# Patient Record
Sex: Male | Born: 1985 | Race: Black or African American | Hispanic: No | Marital: Single | Smoking: Current every day smoker
Health system: Southern US, Community
[De-identification: ages and names within clinical notes are randomized; demographics above are authoritative.]

## PROBLEM LIST (undated history)

## (undated) HISTORY — PX: CHEST TUBE INSERTION: SHX231

---
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
## 2008-12-25 ENCOUNTER — Emergency Department (HOSPITAL_COMMUNITY): Admission: EM | Admit: 2008-12-25 | Discharge: 2008-12-25 | Payer: Self-pay | Admitting: Emergency Medicine

## 2009-01-03 ENCOUNTER — Emergency Department (HOSPITAL_COMMUNITY): Admission: EM | Admit: 2009-01-03 | Discharge: 2009-01-03 | Payer: Self-pay | Admitting: Emergency Medicine
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
## 2010-11-08 LAB — CBC
HCT: 44.9
MCHC: 33.8
MCV: 92.6
RBC: 4.77
RBC: 4.85
RDW: 13.1
RDW: 13.3
WBC: 12.6 — ABNORMAL HIGH

## 2010-11-08 LAB — DIFFERENTIAL
Basophils Absolute: 0
Basophils Relative: 0
Eosinophils Absolute: 0
Lymphocytes Relative: 6 — ABNORMAL LOW
Neutro Abs: 11.2 — ABNORMAL HIGH

## 2010-11-08 LAB — TYPE AND SCREEN

## 2010-11-08 LAB — HEMOGLOBIN: Hemoglobin: 14.6

## 2019-09-07 ENCOUNTER — Ambulatory Visit (HOSPITAL_COMMUNITY)
Admission: RE | Admit: 2019-09-07 | Discharge: 2019-09-07 | Disposition: A | Discharge status: Home / Self Care | Payer: Self-pay | Attending: Psychiatry | Admitting: Psychiatry

## 2019-09-08 ENCOUNTER — Ambulatory Visit (HOSPITAL_COMMUNITY)
Admission: EM | Admit: 2019-09-08 | Discharge: 2019-09-08 | Disposition: A | Discharge status: Home / Self Care | Payer: No Payment, Other | Attending: Psychiatry | Admitting: Psychiatry

## 2019-09-08 ENCOUNTER — Other Ambulatory Visit: Payer: Self-pay

## 2019-09-08 DIAGNOSIS — R45851 Suicidal ideations: Secondary | ICD-10-CM | POA: Insufficient documentation

## 2019-09-08 DIAGNOSIS — R441 Visual hallucinations: Secondary | ICD-10-CM | POA: Insufficient documentation

## 2019-09-08 DIAGNOSIS — F29 Unspecified psychosis not due to a substance or known physiological condition: Secondary | ICD-10-CM

## 2019-09-08 DIAGNOSIS — F4310 Post-traumatic stress disorder, unspecified: Secondary | ICD-10-CM

## 2019-09-08 DIAGNOSIS — F329 Major depressive disorder, single episode, unspecified: Secondary | ICD-10-CM | POA: Insufficient documentation

## 2019-09-08 DIAGNOSIS — Z652 Problems related to release from prison: Secondary | ICD-10-CM | POA: Insufficient documentation

## 2019-09-08 MED ORDER — RISPERIDONE 1 MG PO TABS: 1.0000 mg | ORAL_TABLET | Freq: Two times a day (BID) | ORAL | 1 refills | Status: DC
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
## 2019-09-08 NOTE — Discharge Instructions (Signed)
Keep scheduled appointments Schedule an appointment for PCP Take medications as prescribed
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
## 2019-09-09 ENCOUNTER — Encounter (INDEPENDENT_AMBULATORY_CARE_PROVIDER_SITE_OTHER): Payer: Self-pay | Admitting: Primary Care

## 2019-09-09 ENCOUNTER — Ambulatory Visit (INDEPENDENT_AMBULATORY_CARE_PROVIDER_SITE_OTHER): Payer: Self-pay | Admitting: Primary Care

## 2019-09-09 ENCOUNTER — Other Ambulatory Visit: Payer: Self-pay

## 2019-09-09 ENCOUNTER — Ambulatory Visit: Payer: Self-pay | Attending: Primary Care | Admitting: Licensed Clinical Social Worker

## 2019-09-09 VITALS — BP 121/78 | HR 76 | Temp 98.6°F | Resp 16 | Ht 73.0 in | Wt 255.0 lb

## 2019-09-09 DIAGNOSIS — Z09 Encounter for follow-up examination after completed treatment for conditions other than malignant neoplasm: Secondary | ICD-10-CM

## 2019-09-09 DIAGNOSIS — F22 Delusional disorders: Secondary | ICD-10-CM

## 2019-09-09 DIAGNOSIS — E6609 Other obesity due to excess calories: Secondary | ICD-10-CM

## 2019-09-09 DIAGNOSIS — F4323 Adjustment disorder with mixed anxiety and depressed mood: Secondary | ICD-10-CM

## 2019-09-09 DIAGNOSIS — Z6833 Body mass index (BMI) 33.0-33.9, adult: Secondary | ICD-10-CM

## 2019-09-09 DIAGNOSIS — Z7689 Persons encountering health services in other specified circumstances: Secondary | ICD-10-CM

## 2019-09-09 MED ORDER — RISPERIDONE 1 MG PO TABS: 1.0000 mg | ORAL_TABLET | Freq: Two times a day (BID) | ORAL | 1 refills | Status: DC

## 2019-09-09 MED FILL — risperiDONE 1 MG TABS: 1 | 30 days supply | Qty: 60 | Fill #0

## 2019-09-09 NOTE — Progress Notes (Signed)
Assessment and Plan: Diagnoses and all orders for this visit:  Delusional disorder (HCC) -     risperiDONE (RISPERDAL) 1 MG tablet; Take 1 tablet (1 mg total) by mouth 2 (two) times daily.  Encounter to establish care Gwinda Passe, NP-C will be your  (PCP) she is mastered prepared . She is skilled to diagnosed and treat illness. Also able to answer health concern as well as continuing care of varied medical conditions, not limited by cause, organ system, or diagnosis.   Hospital discharge follow-up Patient has been scheduled to see clinical social worker today information given for walk-in clinic for psychosis prescription refill 1 time only until seen by therapist.  Class 1 obesity due to excess calories without serious comorbidity with body mass index (BMI) of 33.0 to 33.9 in adult Obesity is 30-39 indicating an excess in caloric intake or underlining conditions. This may lead to other co-morbidities. Lifestyle modifications of diet and exercise may reduce obesity.  Increased risk for respiratory problems, hypertension, diabetes, cardiovascular disease    HPI Mr. James Green 34 y.o.male presents for follow up from Emergency room on 09/08/19, patient was discharged from the emergency room 09/08/19, patient was seen for suicidal ideations with a plan to jump over an overpass.  He reported that he has  Dealt with these  symptoms for several years.  He states that he experienced his first hallucination when he was 34 years old and was telling him to kill himself.   No past medical history on file.   No Known Allergies    Current Outpatient Medications on File Prior to Visit  Medication Sig Dispense Refill  . acetaminophen (TYLENOL) 500 MG tablet Take 1,000 mg by mouth every 6 (six) hours as needed for headache.    . risperiDONE (RISPERDAL) 1 MG tablet Take 1 tablet (1 mg total) by mouth 2 (two) times daily. 60 tablet 1   No current facility-administered medications on file prior  to visit.    ROS: all negative except above.   Physical Exam: Filed Weights   09/09/19 1453  Weight: 255 lb (115.7 kg)   BP 121/78   Pulse 76   Temp 98.6 F (37 C)   Resp 16   Ht 6\' 1"  (1.854 m)   Wt 255 lb (115.7 kg)   SpO2 98%   BMI 33.64 kg/m  General Appearance: Well nourished, in no apparent distress. Eyes: PERRLA, EOMs, conjunctiva no swelling or erythema Sinuses: No Frontal/maxillary tenderness ENT/Mouth: Ext aud canals clear, TMs without erythema, bulging. No erythema, swelling, or exudate on post pharynx.  Tonsils not swollen or erythematous. Hearing normal.  Neck: Supple, thyroid normal.  Respiratory: Respiratory effort normal, BS equal bilaterally without rales, rhonchi, wheezing or stridor.  Cardio: RRR with no MRGs. Brisk peripheral pulses without edema.  Abdomen: Soft, + BS.  Non tender, no guarding, rebound, hernias, masses. Lymphatics: Non tender without lymphadenopathy.  Musculoskeletal: Full ROM, 5/5 strength, normal gait.  Skin: Warm, dry without rashes, lesions, ecchymosis.  Neuro: Cranial nerves intact. Normal muscle tone, no cerebellar symptoms. Sensation intact.  Psych: Awake and oriented X 3, normal affect, Insight and Judgment appropriate.     , NP 3:02 PM [facility]

## 2019-09-09 NOTE — Patient Instructions (Addendum)
Please use [facility] located on [address] [place].  Friday they have a walk-in clinic from 1-5.  Please make it a priority to be the this Friday. A 30-day refill of your Risperdal 1mg  twice daily.  This is a one-time refill only to get to establish with behavioral health and follow-up with clinical social worker today at 4:10

## 2019-09-09 NOTE — Progress Notes (Signed)
Here for HFU  Denies any thoughts of suicidal ideation at this time.

## 2019-09-11 ENCOUNTER — Telehealth: Payer: Self-pay | Admitting: Licensed Clinical Social Worker

## 2019-09-11 NOTE — Telephone Encounter (Signed)
Call placed to patient to inform him that prescribed medication is waiting at [facility] for $10. Pt's voicemail is not set up; therefore, LCSW was unable to leave a message.

## 2019-09-17 ENCOUNTER — Telehealth (INDEPENDENT_AMBULATORY_CARE_PROVIDER_SITE_OTHER): Payer: Self-pay | Admitting: Licensed Clinical Social Worker

## 2019-09-17 NOTE — Telephone Encounter (Signed)
Call placed to patient regarding medications. LCSW left a message with patient's girlfriend requesting a return call.

## 2019-09-23 NOTE — Progress Notes (Signed)
Integrated Behavioral Health Initial Visit  MRN: [id number] Name: James Green  Number of Integrated Behavioral Health Clinician visits:: 1/6 Session Start time: 4:30 PM  Session End time: 5:00 PM Total time: 30  Type of Service: Integrated Behavioral Health- Individual Interpretor:No. Interpretor Name and Language: na   Warm Hand Off Completed.       SUBJECTIVE: James Green is a 34 y.o. male accompanied by Self Patient was referred by NP Randa Evens for care of her health conditions. 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  Patient reports ongoing stress which resulted in recent hospitalization Duration of problem: Ongoing; Severity of problem: severe  OBJECTIVE: Mood: Anxious and Affect: Appropriate Risk of harm to self or others: No plan to harm self or others patient denies current suicidal and/or homicidal ideations.  Protective factors were identified and crisis resources system was provided  LIFE CONTEXT: Family and Social: Received strong support from girlfriend who he resides with School/Work: Employed  Self-Care: She has agreed to participate in medication management through provider Life Changes: Patient reports difficulty managing stressors  GOALS ADDRESSED: Patient will: 1. Reduce symptoms of: stress 2. Increase knowledge and/or ability of: coping skills and healthy habits  3. Demonstrate ability to: Increase healthy adjustment to current life circumstances and Increase adequate support systems for patient/family  INTERVENTIONS: Interventions utilized: Supportive Counseling and Link to [facility]  Standardized Assessments completed: Not Needed  ASSESSMENT: Patient currently experiencing difficulty managing stress.  He has recently returned from serving over 10 years in prison and has had difficulty transitioning.  Patient denies current suicidal and/or homicidal ideations.   Protective factors were identified and crisis resources system was provided.   Patient may benefit from therapy and medication management to strengthen support system.  Validation and encouragement provided.  Healthy coping skills identified by patient.  LCSW provided patient with resources to establish therapy and medication management.  PLAN: 1. Follow up with behavioral health clinician on : Contact LCSW with any additional behavioral health and/or resource needs 2. Behavioral recommendations: Utilize resources provided and continue to utilize healthy coping skills 3. Referral(s): Community Mental Health Services (LME/Outside Clinic) 4. "From scale of 1-10, how likely are you to follow plan?":   Bridgett Larsson, LCSW 09/23/2019 10:07 AM

## 2019-10-13 ENCOUNTER — Encounter (HOSPITAL_COMMUNITY): Payer: Self-pay | Admitting: Psychiatry

## 2019-11-08 ENCOUNTER — Other Ambulatory Visit: Payer: Self-pay

## 2019-11-08 ENCOUNTER — Emergency Department (HOSPITAL_COMMUNITY)
Admission: EM | Admit: 2019-11-08 | Discharge: 2019-11-09 | Disposition: A | Discharge status: Other Acute Inpatient Hospital | Payer: Self-pay | Attending: Emergency Medicine | Admitting: Emergency Medicine

## 2019-11-08 ENCOUNTER — Ambulatory Visit (HOSPITAL_COMMUNITY)
Admission: EM | Admit: 2019-11-08 | Discharge: 2019-11-08 | Disposition: A | Discharge status: Other Acute Inpatient Hospital | Payer: No Payment, Other | Attending: Psychiatry | Admitting: Psychiatry

## 2019-11-08 DIAGNOSIS — Z20822 Contact with and (suspected) exposure to covid-19: Secondary | ICD-10-CM | POA: Insufficient documentation

## 2019-11-08 DIAGNOSIS — Z560 Unemployment, unspecified: Secondary | ICD-10-CM | POA: Diagnosis not present

## 2019-11-08 DIAGNOSIS — Z9151 Personal history of suicidal behavior: Secondary | ICD-10-CM | POA: Insufficient documentation

## 2019-11-08 DIAGNOSIS — F4310 Post-traumatic stress disorder, unspecified: Secondary | ICD-10-CM | POA: Diagnosis not present

## 2019-11-08 DIAGNOSIS — F17200 Nicotine dependence, unspecified, uncomplicated: Secondary | ICD-10-CM | POA: Insufficient documentation

## 2019-11-08 DIAGNOSIS — F32A Depression, unspecified: Secondary | ICD-10-CM

## 2019-11-08 DIAGNOSIS — S0083XA Contusion of other part of head, initial encounter: Secondary | ICD-10-CM | POA: Insufficient documentation

## 2019-11-08 DIAGNOSIS — R45851 Suicidal ideations: Secondary | ICD-10-CM | POA: Insufficient documentation

## 2019-11-08 DIAGNOSIS — F333 Major depressive disorder, recurrent, severe with psychotic symptoms: Secondary | ICD-10-CM | POA: Diagnosis not present

## 2019-11-08 DIAGNOSIS — W228XXA Striking against or struck by other objects, initial encounter: Secondary | ICD-10-CM | POA: Insufficient documentation

## 2019-11-08 DIAGNOSIS — F329 Major depressive disorder, single episode, unspecified: Secondary | ICD-10-CM | POA: Insufficient documentation

## 2019-11-08 LAB — RAPID URINE DRUG SCREEN, HOSP PERFORMED
Amphetamines: NOT DETECTED
Barbiturates: NOT DETECTED
Benzodiazepines: NOT DETECTED
Cocaine: NOT DETECTED
Opiates: NOT DETECTED
Tetrahydrocannabinol: POSITIVE — AB

## 2019-11-08 LAB — CBC WITH DIFFERENTIAL/PLATELET
Abs Immature Granulocytes: 0.05 10*3/uL (ref 0.00–0.07)
Basophils Absolute: 0 10*3/uL (ref 0.0–0.1)
Basophils Relative: 1 %
Eosinophils Absolute: 0.1 10*3/uL (ref 0.0–0.5)
Eosinophils Relative: 1 %
HCT: 46.5 % (ref 39.0–52.0)
Hemoglobin: 15.9 g/dL (ref 13.0–17.0)
Immature Granulocytes: 1 %
Lymphocytes Relative: 34 %
Lymphs Abs: 2.9 10*3/uL (ref 0.7–4.0)
MCH: 31.7 pg (ref 26.0–34.0)
MCHC: 34.2 g/dL (ref 30.0–36.0)
MCV: 92.8 fL (ref 80.0–100.0)
Monocytes Absolute: 0.6 10*3/uL (ref 0.1–1.0)
Monocytes Relative: 8 %
Neutro Abs: 4.7 10*3/uL (ref 1.7–7.7)
Neutrophils Relative %: 55 %
Platelets: 233 10*3/uL (ref 150–400)
RBC: 5.01 MIL/uL (ref 4.22–5.81)
RDW: 13.2 % (ref 11.5–15.5)
WBC: 8.4 10*3/uL (ref 4.0–10.5)
nRBC: 0 % (ref 0.0–0.2)

## 2019-11-08 LAB — COMPREHENSIVE METABOLIC PANEL
ALT: 22 U/L (ref 0–44)
AST: 23 U/L (ref 15–41)
Albumin: 4.4 g/dL (ref 3.5–5.0)
Alkaline Phosphatase: 58 U/L (ref 38–126)
Anion gap: 14 (ref 5–15)
BUN: 13 mg/dL (ref 6–20)
CO2: 26 mmol/L (ref 22–32)
Calcium: 9.9 mg/dL (ref 8.9–10.3)
Chloride: 101 mmol/L (ref 98–111)
Creatinine, Ser: 1.37 mg/dL — ABNORMAL HIGH (ref 0.61–1.24)
GFR, Estimated: >60 mL/min (ref >60)
Glucose, Bld: 89 mg/dL (ref 70–99)
Potassium: 3.8 mmol/L (ref 3.5–5.1)
Sodium: 141 mmol/L (ref 135–145)
Total Bilirubin: 1 mg/dL (ref 0.3–1.2)
Total Protein: 8 g/dL (ref 6.5–8.1)

## 2019-11-08 LAB — RESPIRATORY PANEL BY RT PCR (FLU A&B, COVID)
Influenza A by PCR: NEGATIVE
Influenza B by PCR: NEGATIVE
SARS Coronavirus 2 by RT PCR: NEGATIVE

## 2019-11-08 LAB — ETHANOL: Alcohol, Ethyl (B): <10 mg/dL (ref <10)

## 2019-11-08 MED ORDER — ALUM & MAG HYDROXIDE-SIMETH 200-200-20 MG/5ML PO SUSP: 30.0000 mL | Freq: Four times a day (QID) | ORAL | Status: DC | PRN

## 2019-11-08 MED ORDER — ACETAMINOPHEN 325 MG PO TABS: 650.0000 mg | ORAL_TABLET | Freq: Once | ORAL | Status: DC

## 2019-11-08 MED ORDER — ACETAMINOPHEN 500 MG PO TABS: 1000.0000 mg | ORAL_TABLET | Freq: Four times a day (QID) | ORAL | Status: DC | PRN

## 2019-11-08 MED ORDER — ONDANSETRON HCL 4 MG PO TABS: 4.0000 mg | ORAL_TABLET | Freq: Three times a day (TID) | ORAL | Status: DC | PRN

## 2019-11-08 MED ORDER — RISPERIDONE 1 MG PO TABS
1.0000 mg | ORAL_TABLET | Freq: Two times a day (BID) | ORAL | Status: DC
  Administered 2019-11-09: 1 mg via ORAL
  Filled 2019-11-08: qty 1

## 2019-11-08 MED ORDER — LORAZEPAM 1 MG PO TABS
1.0000 mg | ORAL_TABLET | Freq: Four times a day (QID) | ORAL | Status: DC | PRN
  Administered 2019-11-08 – 2019-11-09 (×2): 1 mg via ORAL
  Filled 2019-11-08 (×2): qty 1

## 2019-11-08 NOTE — ED Provider Notes (Signed)
Behavioral Health Admission H&P [facility](FBC & OBS)  Date: 11/08/19 Patient Name: James Green MRN: [id number] Chief Complaint:  Chief Complaint  Patient presents with  . Suicidal      Diagnoses:  Final diagnoses:  MDD (major depressive disorder), recurrent, severe, with psychosis (HCC)  PTSD (post-traumatic stress disorder)    HPI: [facility]-in assessment accompanied by mobile crisis counselor. Mobile crisis counselor was contacted by patient's girlfriend after patient endorsed suicidal ideations with a plan to cut himself.  Patient states "my girlfriend called the crisis team on me because of the way that I have been acting lately, I feel like I lost control of my people." Patient refers to himself as "[name]" times then refers to himself as "[name]" at times. Patient presents with paranoid behavior when discussing girlfriend contacting mobile crisis to "force patient to come here."  Patient endorses suicidal ideations. Patient states "I will leave here and I will go someplace and kill myself and no one will ever find me." Patient does not disclose plan at this time. Patient endorses 2 prior suicide attempts, last attempt approximately 10 months ago. Patient engaging in self-harm behaviors. Patient observed attempting to bang his head against the table in punching himself with a closed fist in the face.  Patient denies homicidal ideations. Patient endorses auditory and visual hallucinations. Patient states "I was convicted of second-degree murder and I spent 10-1/2 years in prison but my friend actually killed the man." Patient reports when he is alone he can see the face of the man that was killed. Patient reports auditory hallucinations, hearing "kill yourself and go to hell so we can fight."  Patient assessed by nurse practitioner. Patient alert and oriented. Patient emotionally labile throughout assessment. Patient tearful  at times, patient physically and verbally aggressive at times.  Patient reports recent stressors include being forced to move in with his girlfriend and [place] 2 children [place] after his brother put him out of the home on 10/06/2019. Patient reports he is unemployed currently. Patient reports feeling unable to work, this is a stressor as he has responsibilities. Patient reports he was released from prison in January 2021 and was dismissed from probation on 10/06/2019. Patient reports occasional alcohol use. Patient reports daily marijuana use, patient believes marijuana improves his mood.  Patient diagnosed with PTSD and major depressive disorder with psychosis.  [facility]  Patient reports he did not follow-up with outpatient psychiatry appointments and did not continue Risperdal after first dose.  Patient states "I cannot explain why I did not follow-up."  Patient agrees with plan to restart Risperdal.  Patient offered support and encouragement.  Patient gives verbal consent to speak with his girlfriend, Engineer, maintenancehanice. Patients girlfriend reports she is very concerned for patient's safety. Patient's girlfriend reports patient reported suicidal ideations with a plan to cut himself with a knife to her earlier today. Also girlfriend reports, patient "banged his head on the wall about 50 times until his eyes rolled back in his head last night."   PHQ 2-9:    [facility]/09/[facility]  Thoughts that you would be better off dead, or of hurting yourself in some way Several days  PHQ-9 Total Score 21        Total Time spent with patient: 30 minutes  Musculoskeletal  Strength & Muscle Tone: within normal limits Gait & Station: normal Patient leans:  N/A  Psychiatric Specialty Exam  Presentation General Appearance: Disheveled  Eye Contact:Fair  Speech:Clear and Coherent  Speech  Volume:Increased  Handedness:Right   Mood and Affect  Mood:Anxious;Labile  Affect:Labile;Inappropriate;Tearful   Thought Process  Thought Processes:Coherent  Descriptions of Associations:Intact  Orientation:Full (Time, Place and Person)  Thought Content:Paranoid Ideation  Hallucinations:Hallucinations: Auditory;Visual Description of Auditory Hallucinations: Patient states "I was convicted of second-degree murder and sometimes I see the man's face that my friend murdered." Patient states "the man tells me to kill myself and go to hell so that we can fight."  Ideas of Reference:Paranoia  Suicidal Thoughts:Suicidal Thoughts: Yes, Passive SI Passive Intent and/or Plan: With Intent;Without Plan  Homicidal Thoughts:Homicidal Thoughts: No   Sensorium  Memory:Immediate Good;Recent Good;Remote Good  Judgment:Impaired  Insight:Lacking   Executive Functions  Concentration:Fair  Attention Span:Fair  Recall:Fair  Fund of Knowledge:Fair  Language:Fair   Psychomotor Activity  Psychomotor Activity:Psychomotor Activity: Increased   Assets  Assets:Communication Skills;Desire for Improvement;Physical Health;Resilience;Social Support   Sleep  Sleep:Sleep: Poor   Physical Exam Vitals and nursing note reviewed.  Constitutional:      Appearance: He is well-developed.  HENT:     Head: Normocephalic.  Cardiovascular:     Rate and Rhythm: Normal rate.  Pulmonary:     Effort: Pulmonary effort is normal.  Neurological:     Mental Status: He is alert and oriented to person, place, and time.  Psychiatric:        Attention and Perception: He perceives auditory and visual hallucinations.        Mood and Affect: Mood is anxious. Affect is labile and inappropriate.        Speech: Speech is tangential.        Behavior: Behavior is agitated and aggressive.        Thought Content: Thought content is paranoid and delusional. Thought content includes suicidal ideation.  Thought content includes suicidal plan.        Judgment: Judgment is impulsive and inappropriate.    Review of Systems  Constitutional: Negative.   HENT: Negative.   Eyes: Negative.   Respiratory: Negative.   Cardiovascular: Negative.   Gastrointestinal: Negative.   Genitourinary: Negative.   Musculoskeletal: Negative.   Skin: Negative.   Neurological: Negative.   Endo/Heme/Allergies: Negative.   Psychiatric/Behavioral: Positive for hallucinations, substance abuse and suicidal ideas. The patient is nervous/anxious.     There were no vitals taken for this visit. There is no height or weight on file to calculate BMI.  Past Psychiatric History: MDD with psychosis, PTSD  Is the patient at risk to self? Yes  Has the patient been a risk to self in the past 6 months? Yes .    Has the patient been a risk to self within the distant past? Yes   Is the patient a risk to others? No   Has the patient been a risk to others in the past 6 months? No   Has the patient been a risk to others within the distant past? No   Past Medical History: No past medical history on file. No past surgical history on file.  Family History: No family history on file.  Social History:  Social History   Socioeconomic History  . Marital status: Single    Spouse name: Not on file  . Number of children: Not on file  . Years of education: Not on file  . Highest education level: Not on file  Occupational History  . Not on file  Tobacco  Use  . Smoking status: Current Every Day Smoker    Packs/day: 0.50  . Smokeless tobacco: Never Used  Substance and Sexual Activity  . Alcohol use: Yes    Alcohol/week: 2.0 standard drinks    Types: 2 Cans of beer per week    Comment: ocassionaly   . Drug use: Not Currently  . Sexual activity: Not Currently  Other Topics Concern  . Not on file  Social History Narrative  . Not on file   Social Determinants of Health   Financial Resource Strain:   . Difficulty of  Paying Living Expenses: Not on file  Food Insecurity:   . Worried About Programme researcher, broadcasting/film/video in the Last Year: Not on file  . Ran Out of Food in the Last Year: Not on file  Transportation Needs:   . Lack of Transportation (Medical): Not on file  . Lack of Transportation (Non-Medical): Not on file  Physical Activity:   . Days of Exercise per Week: Not on file  . Minutes of Exercise per Session: Not on file  Stress:   . Feeling of Stress : Not on file  Social Connections:   . Frequency of Communication with Friends and Family: Not on file  . Frequency of Social Gatherings with Friends and Family: Not on file  . Attends Religious Services: Not on file  . Active Member of Clubs or Organizations: Not on file  . Attends Banker Meetings: Not on file  . Marital Status: Not on file  Intimate Partner Violence:   . Fear of Current or Ex-Partner: Not on file  . Emotionally Abused: Not on file  . Physically Abused: Not on file  . Sexually Abused: Not on file    SDOH:  SDOH Screenings   Alcohol Screen:   . Last Alcohol Screening Score (AUDIT): Not on file  Depression (PHQ2-9): Medium Risk  . PHQ-2 Score: 21  Financial Resource Strain:   . Difficulty of Paying Living Expenses: Not on file  Food Insecurity:   . Worried About Programme researcher, broadcasting/film/video in the Last Year: Not on file  . Ran Out of Food in the Last Year: Not on file  Housing:   . Last Housing Risk Score: Not on file  Physical Activity:   . Days of Exercise per Week: Not on file  . Minutes of Exercise per Session: Not on file  Social Connections:   . Frequency of Communication with Friends and Family: Not on file  . Frequency of Social Gatherings with Friends and Family: Not on file  . Attends Religious Services: Not on file  . Active Member of Clubs or Organizations: Not on file  . Attends Banker Meetings: Not on file  . Marital Status: Not on file  Stress:   . Feeling of Stress : Not on file   Tobacco Use: High Risk  . Smoking Tobacco Use: Current Every Day Smoker  . Smokeless Tobacco Use: Never Used  Transportation Needs:   . Freight forwarder (Medical): Not on file  . Lack of Transportation (Non-Medical): Not on file    Last Labs:  No visits with results within 6 Month(s) from this visit.  Latest known visit with results is:  Admission on 11/14/2006, Discharged on 11/18/2006  Component Date Value Ref Range Status  . ABO/RH(D) 11/14/2006 B POS   Final  . Antibody Screen 11/14/2006 NEG   Final  . Sample Expiration 11/14/2006 11/17/2006   Final  . Unit  Number 11/14/2006 [id number]   Final  . Blood Component Type 11/14/2006 RBC LR PHER1   Final  . Status of Unit 11/14/2006 REL FROM [facility]   Final  . Transfusion Status 11/14/2006 OK TO TRANSFUSE   Final  . Crossmatch Result 11/14/2006 COMPATIBLE   Final  . Unit tag comment 11/14/2006 VERBAL ORDERS PER DR ZAMMIT   Final  . Unit Number 11/14/2006 [id number]   Final  . Blood Component Type 11/14/2006 RBC LR PHER2   Final  . Status of Unit 11/14/2006 REL FROM [facility]   Final  . Transfusion Status 11/14/2006 OK TO TRANSFUSE   Final  . Crossmatch Result 11/14/2006 COMPATIBLE   Final  . Unit tag comment 11/14/2006 VERBAL ORDERS PER DR ZAMMIT   Final  . ABO/RH(D) 11/14/2006 B POS   Final  . WBC 11/14/2006 6.6   Final  . RBC 11/14/2006 4.77   Final  . Hemoglobin 11/14/2006 15.0   Final  . HCT 11/14/2006 44.4   Final  . MCV 11/14/2006 93.0   Final  . MCHC 11/14/2006 33.8   Final  . RDW 11/14/2006 13.1   Final  . Platelets 11/14/2006 207   Final  . Prothrombin Time 11/14/2006 13.9   Final  . INR 11/14/2006 1.1   Final  . WBC 11/14/2006 12.6*  Final  . RBC 11/14/2006 4.85   Final  . Hemoglobin 11/14/2006 15.1   Final  . HCT 11/14/2006 44.9   Final  . MCV 11/14/2006 92.6   Final  . MCHC 11/14/2006 33.6   Final  . RDW 11/14/2006 13.3   Final  . Platelets 11/14/2006 215   Final  . Neutrophils Relative % 11/14/2006 88*  Final   . Neutro Abs 11/14/2006 11.2*  Final  . Lymphocytes Relative 11/14/2006 6*  Final  . Lymphs Abs 11/14/2006 0.7*  Final  . Monocytes Relative 11/14/2006 6   Final  . Monocytes Absolute 11/14/2006 0.7   Final  . Eosinophils Relative 11/14/2006 0   Final  . Eosinophils Absolute 11/14/2006 0.0   Final  . Basophils Relative 11/14/2006 0   Final  . Basophils Absolute 11/14/2006 0.0   Final  . Hemoglobin 11/15/2006 14.6   Final    Allergies: Patient has no known allergies.  PTA Medications: (Not in a hospital admission)   Medical Decision Making  Patient reviewed with Dr. Nelly Rout.  Patient presents with active suicidal ideations and active self-harm behaviors.  Patient requires involuntary commitment petition at this time. Patient will be transferred to [facility] emergency department for medical clearance and to await appropriate inpatient psychiatric placement.  Verbal handoff to [facility] emergency department physician. Recommendations  Based on my evaluation the patient appears to have an emergency medical condition for which I recommend the patient be transferred to the emergency department for further evaluation.  Patrcia Dolly, FNP 11/08/19  5:37 PM

## 2019-11-08 NOTE — ED Triage Notes (Signed)
Pt arrived via police escort, sent from [facility], due to IVC, per pt girlfriend, pt manic at home, becoming aggressive with her. Pt calm and cooperative in triage.

## 2019-11-08 NOTE — ED Notes (Signed)
Pt refused blood draw
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
## 2019-11-08 NOTE — ED Notes (Addendum)
Pt was asked if girlfriend could have keys and gave his permission to hand his keys to his girlfriend.  Girlfriend picked up keys.

## 2019-11-08 NOTE — ED Notes (Signed)
Patient changed into paper scrubs and wanded by security. Two labeled patient belongings secured at triage nurses station.

## 2019-11-08 NOTE — ED Provider Notes (Signed)
James Green-EMERGENCY DEPT Provider Note   CSN: [id number] Arrival date & time: 11/08/19  1858     History Chief Complaint  Patient presents with  . Psychiatric Evaluation    James Green is a 34 y.o. male.  HPI   Patient presented to the ED for medical clearance.  Has history of depression as well as P TSD.  Patient states he has been incarcerated for 10 years.  He is having a difficult time adjusting since getting out of jail.  Patient states he still has a lot of anxiety and fear it is interfering with his ability to work.  Patient has been banging his head against the wall.  Patient states he does that to try to destress.  Patient reportedly told his girlfriend that he was going to go away somewhere and kill himself and no one would find him.  Patient was evaluated by behavioral health center today  No past medical history on file.  Patient Active Problem List   Diagnosis Date Noted  . Severe episode of recurrent major depressive disorder, with psychotic features (HCC)   . PTSD (post-traumatic stress disorder)     No past surgical history on file.     No family history on file.  Social History   Tobacco Use  . Smoking status: Current Every Day Smoker    Packs/day: 0.50  . Smokeless tobacco: Never Used  Substance Use Topics  . Alcohol use: Yes    Alcohol/week: 2.0 standard drinks    Types: 2 Cans of beer per week    Comment: ocassionaly   . Drug use: Not Currently    Home Medications Prior to Admission medications   Medication Sig Start Date End Date Taking? Authorizing Provider  acetaminophen (TYLENOL) 500 MG tablet Take 1,000 mg by mouth every 6 (six) hours as needed for headache.    [provider]  risperiDONE (RISPERDAL) 1 MG tablet Take 1 tablet (1 mg total) by mouth 2 (two) times daily. 09/09/19 11/08/19  Grayce Sessions, NP    Allergies    Patient has no known allergies.  Review of Systems   Review of Systems    All other systems reviewed and are negative.   Physical Exam Updated Vital Signs BP 100/85   Pulse (!) 58   Temp 98.6 F (37 C)   Resp 16   SpO2 98%   Physical Exam Vitals and nursing note reviewed.  Constitutional:      General: He is not in acute distress.    Appearance: He is well-developed.  HENT:     Head: Normocephalic.     Comments: Small contusion forehead    Right Ear: External ear normal.     Left Ear: External ear normal.  Eyes:     General: No scleral icterus.       Right eye: No discharge.        Left eye: No discharge.     Conjunctiva/sclera: Conjunctivae normal.  Neck:     Trachea: No tracheal deviation.  Cardiovascular:     Rate and Rhythm: Normal rate and regular rhythm.  Pulmonary:     Effort: Pulmonary effort is normal. No respiratory distress.     Breath sounds: Normal breath sounds. No stridor. No wheezing or rales.  Abdominal:     General: Bowel sounds are normal. There is no distension.     Palpations: Abdomen is soft.     Tenderness: There is no abdominal tenderness. There is  no guarding or rebound.  Musculoskeletal:        General: No tenderness.     Cervical back: Neck supple.  Skin:    General: Skin is warm and dry.     Findings: No rash.  Neurological:     Mental Status: He is alert.     Cranial Nerves: No cranial nerve deficit (no facial droop, extraocular movements intact, no slurred speech).     Sensory: No sensory deficit.     Motor: No abnormal muscle tone or seizure activity.     Coordination: Coordination normal.  Psychiatric:        Attention and Perception: Attention normal.        Mood and Affect: Mood is depressed.        Speech: Speech normal.        Behavior: Behavior is cooperative.        Thought Content: Thought content includes suicidal ideation.     Comments: Somewhat rambling speech     ED Results / Procedures / Treatments   Labs (all labs ordered are listed, but only abnormal results are displayed) Labs  Reviewed  COMPREHENSIVE METABOLIC PANEL - Abnormal; Notable for the following components:      Result Value   Creatinine, Ser 1.37 (*)    All other components within normal limits  RAPID URINE DRUG SCREEN, HOSP PERFORMED - Abnormal; Notable for the following components:   Tetrahydrocannabinol POSITIVE (*)    All other components within normal limits  RESPIRATORY PANEL BY RT PCR (FLU A&B, COVID)  ETHANOL  CBC WITH DIFFERENTIAL/PLATELET    EKG None  Radiology No results found.  Procedures Procedures (including critical care time)  Medications Ordered in ED Medications  acetaminophen (TYLENOL) tablet 650 mg (has no administration in time range)  acetaminophen (TYLENOL) tablet 1,000 mg (has no administration in time range)  risperiDONE (RISPERDAL) tablet 1 mg (has no administration in time range)  alum & mag hydroxide-simeth (MAALOX/MYLANTA) 200-200-20 MG/5ML suspension 30 mL (has no administration in time range)  ondansetron (ZOFRAN) tablet 4 mg (has no administration in time range)  LORazepam (ATIVAN) tablet 1 mg (1 mg Oral Given 11/08/19 2033)    ED Course  I have reviewed the triage vital signs and the nursing notes.  Pertinent labs & imaging results that were available during my care of the patient were reviewed by me and considered in my medical decision making (see chart for details).    MDM Rules/Calculators/A&P                         The patient has been placed in psychiatric observation due to the need to provide a safe environment for the patient while obtaining psychiatric consultation and evaluation, as well as ongoing medical and medication management to treat the patient's condition.  The patient has been placed under full IVC at this time.  Labs reviewed.   Pt medically cleared.  Awaiting psychiatric disposition. Final Clinical Impression(s) / ED Diagnoses Final diagnoses:  Depression, unspecified depression type      James Dibbles, MD 11/08/19 2232

## 2019-11-09 ENCOUNTER — Encounter (HOSPITAL_COMMUNITY): Payer: Self-pay

## 2019-11-09 ENCOUNTER — Emergency Department (HOSPITAL_COMMUNITY): Payer: Self-pay

## 2019-11-09 ENCOUNTER — Inpatient Hospital Stay (HOSPITAL_COMMUNITY)
Admission: AD | Admit: 2019-11-09 | Discharge: 2019-11-13 | DRG: 897 | Disposition: A | Discharge status: Home / Self Care | Payer: Federal, State, Local not specified - Other | Source: Intra-hospital | Attending: Psychiatry | Admitting: Psychiatry

## 2019-11-09 DIAGNOSIS — R454 Irritability and anger: Secondary | ICD-10-CM | POA: Diagnosis present

## 2019-11-09 DIAGNOSIS — R45851 Suicidal ideations: Secondary | ICD-10-CM | POA: Diagnosis present

## 2019-11-09 DIAGNOSIS — Z91410 Personal history of adult physical and sexual abuse: Secondary | ICD-10-CM

## 2019-11-09 DIAGNOSIS — F12922 Cannabis use, unspecified with intoxication with perceptual disturbance: Secondary | ICD-10-CM | POA: Diagnosis not present

## 2019-11-09 DIAGNOSIS — F17210 Nicotine dependence, cigarettes, uncomplicated: Secondary | ICD-10-CM | POA: Diagnosis present

## 2019-11-09 DIAGNOSIS — G4700 Insomnia, unspecified: Secondary | ICD-10-CM | POA: Diagnosis present

## 2019-11-09 DIAGNOSIS — F4310 Post-traumatic stress disorder, unspecified: Secondary | ICD-10-CM | POA: Diagnosis present

## 2019-11-09 DIAGNOSIS — F19959 Other psychoactive substance use, unspecified with psychoactive substance-induced psychotic disorder, unspecified: Secondary | ICD-10-CM | POA: Diagnosis present

## 2019-11-09 DIAGNOSIS — F12929 Cannabis use, unspecified with intoxication, unspecified: Secondary | ICD-10-CM | POA: Diagnosis present

## 2019-11-09 DIAGNOSIS — F12951 Cannabis use, unspecified with psychotic disorder with hallucinations: Secondary | ICD-10-CM | POA: Diagnosis present

## 2019-11-09 DIAGNOSIS — F3181 Bipolar II disorder: Secondary | ICD-10-CM | POA: Diagnosis present

## 2019-11-09 DIAGNOSIS — Z79899 Other long term (current) drug therapy: Secondary | ICD-10-CM | POA: Diagnosis not present

## 2019-11-09 DIAGNOSIS — F17200 Nicotine dependence, unspecified, uncomplicated: Secondary | ICD-10-CM | POA: Diagnosis present

## 2019-11-09 DIAGNOSIS — F29 Unspecified psychosis not due to a substance or known physiological condition: Secondary | ICD-10-CM | POA: Diagnosis present

## 2019-11-09 DIAGNOSIS — F17211 Nicotine dependence, cigarettes, in remission: Secondary | ICD-10-CM | POA: Diagnosis not present

## 2019-11-09 MED ORDER — NICOTINE 21 MG/24HR TD PT24: 21.0000 mg | MEDICATED_PATCH | Freq: Once | TRANSDERMAL | Status: DC

## 2019-11-09 MED ORDER — STERILE WATER FOR INJECTION IJ SOLN
INTRAMUSCULAR | Status: AC
  Filled 2019-11-09: qty 10

## 2019-11-09 MED ORDER — ONDANSETRON HCL 4 MG PO TABS: 4.0000 mg | ORAL_TABLET | Freq: Three times a day (TID) | ORAL | Status: DC | PRN

## 2019-11-09 MED ORDER — ACETAMINOPHEN 325 MG PO TABS: 650.0000 mg | ORAL_TABLET | Freq: Four times a day (QID) | ORAL | Status: DC | PRN

## 2019-11-09 MED ORDER — HALOPERIDOL LACTATE 5 MG/ML IJ SOLN
INTRAMUSCULAR | Status: AC
  Administered 2019-11-10: 5 mg via INTRAMUSCULAR
  Filled 2019-11-09: qty 1

## 2019-11-09 MED ORDER — DIPHENHYDRAMINE HCL 50 MG/ML IJ SOLN
INTRAMUSCULAR | Status: AC
  Administered 2019-11-10: 50 mg via INTRAMUSCULAR
  Filled 2019-11-09: qty 1

## 2019-11-09 MED ORDER — DIPHENHYDRAMINE HCL 50 MG/ML IJ SOLN
50.0000 mg | Freq: Once | INTRAMUSCULAR | Status: AC
  Filled 2019-11-09: qty 1

## 2019-11-09 MED ORDER — BENZTROPINE MESYLATE 1 MG/ML IJ SOLN
0.5000 mg | Freq: Two times a day (BID) | INTRAMUSCULAR | Status: DC
  Filled 2019-11-09 (×3): qty 0.5

## 2019-11-09 MED ORDER — RISPERIDONE 1 MG PO TABS
1.0000 mg | ORAL_TABLET | Freq: Three times a day (TID) | ORAL | Status: DC
  Administered 2019-11-09: 1 mg via ORAL
  Filled 2019-11-09: qty 1

## 2019-11-09 MED ORDER — RISPERIDONE 1 MG PO TABS
1.0000 mg | ORAL_TABLET | Freq: Three times a day (TID) | ORAL | Status: DC
  Filled 2019-11-09 (×4): qty 1

## 2019-11-09 MED ORDER — BENZTROPINE MESYLATE 1 MG/ML IJ SOLN: 0.5000 mg | Freq: Two times a day (BID) | INTRAMUSCULAR | Status: DC

## 2019-11-09 MED ORDER — LORAZEPAM 2 MG/ML IJ SOLN: 2.0000 mg | Freq: Once | INTRAMUSCULAR | Status: AC

## 2019-11-09 MED ORDER — HALOPERIDOL LACTATE 5 MG/ML IJ SOLN
5.0000 mg | Freq: Once | INTRAMUSCULAR | Status: AC
  Filled 2019-11-09: qty 1

## 2019-11-09 MED ORDER — ZIPRASIDONE MESYLATE 20 MG IM SOLR
20.0000 mg | Freq: Once | INTRAMUSCULAR | Status: AC
  Administered 2019-11-09: 20 mg via INTRAMUSCULAR
  Filled 2019-11-09: qty 20

## 2019-11-09 MED ORDER — LORAZEPAM 1 MG PO TABS
1.0000 mg | ORAL_TABLET | Freq: Four times a day (QID) | ORAL | Status: DC | PRN
  Filled 2019-11-09: qty 1

## 2019-11-09 MED ORDER — ALUM & MAG HYDROXIDE-SIMETH 200-200-20 MG/5ML PO SUSP: 30.0000 mL | ORAL | Status: DC | PRN

## 2019-11-09 MED ORDER — LORAZEPAM 2 MG/ML IJ SOLN
INTRAMUSCULAR | Status: AC
  Administered 2019-11-10: 2 mg via INTRAMUSCULAR
  Filled 2019-11-09: qty 1

## 2019-11-09 MED ORDER — LORAZEPAM 1 MG PO TABS
1.0000 mg | ORAL_TABLET | Freq: Once | ORAL | Status: AC
  Administered 2019-11-09: 1 mg via ORAL
  Filled 2019-11-09: qty 1

## 2019-11-09 MED ORDER — MAGNESIUM HYDROXIDE 400 MG/5ML PO SUSP: 30.0000 mL | Freq: Every day | ORAL | Status: DC | PRN

## 2019-11-09 NOTE — ED Notes (Addendum)
Patient screaming that when he gets out of here, he "is going to disappear and no one will see him again." He states that everything we do is "in vain" and he is "done with this world."

## 2019-11-09 NOTE — ED Notes (Signed)
Patient transported to CT 

## 2019-11-09 NOTE — ED Provider Notes (Addendum)
  Physical Exam  BP 100/85   Pulse (!) 58   Temp 98.6 F (37 C)   Resp 16   SpO2 98%   Physical Exam  ED Course/Procedures     Procedures  MDM  Patient's family member reportedly called.  States that he had been banging his head repeatedly and hard against a cement floor.  Reportedly been sent in for CT scan also.  Do not see much documentation about this.  With psychiatric complaints however will get head CT to help further evaluate.  Reportedly will be transferred to behavioral health at 8 PM  Head CT negative.  Requesting nicotine patch.  Ready for transfer.       Benjiman Core, MD 11/09/19 Aldona Lento    Benjiman Core, MD 11/09/19 2153

## 2019-11-09 NOTE — ED Notes (Signed)
Pt to room 27. Pt ambulated to room. Pt oriented to unit and room. Pt guarded and irritable. Pt stated, " on a real hunger strike".

## 2019-11-09 NOTE — ED Notes (Signed)
Pt lying down

## 2019-11-09 NOTE — ED Notes (Signed)
[facility] onsite for transfer to [facility] @ this time

## 2019-11-09 NOTE — ED Notes (Signed)
Pt pacing the room. Stating that we are planning to keep him in a tiny room to make him worse.

## 2019-11-09 NOTE — ED Notes (Signed)
Report given to TCU. Patient escorted back to 80. IVC papers and 2 belonging bags taken to TCU. Sitter with patient.

## 2019-11-09 NOTE — Consult Note (Signed)
Telepsych Consultation   Reason for Consult:  Aggressive behavior, hallucinations Referring Physician:  Linwood DibblesKnapp, Jon, MD [facility] Provider[facility]  Patient Identification: James Green MRN:  [id number] Principal Diagnosis: <principal problem not specified> Diagnosis:  Active Problems:   * No active hospital problems. *   Total Time spent with patient: 30 minutes  Brief    Subjective:   James Green is a 34 y.o. male patient admitted [facility] ED under IVC with complaints self injurious behavior and danger to himself.  PER TTS ASSESSMENT NOTE:  Reviewed by this provider:  Orland PenmanJustin Green is a 34 year old male who [facility], suicidal ideations (denies having a plan), auditory hallucinations with command. [facility] who reports pt's girlfriend Philomena Doheny(Shanice Powell: [phone]) called them and asked of they could come to her home to evaluate patient.  Per pt's girlfriend, pt has been banging his head on the walls inside their apartment and then banging his head on the brick walls outside the apartment building. She further reports he banged his about 50x and so hard that she noticed his eyes begin to roll in the back of his head. She states she found patient holding a kitchen knife to his neck because the voices are telling him to "end it all". She states he kept telling her he was going to kill himself if she didn't back up.   HPI:  James Green, 34 y.o., male patient seen via tele psych by this provider, consulted with Dr. Lucianne MussKumar; and chart reviewed on 11/09/19.  On evaluation James Green reports he has been in jail for 10 years and now currently living with his girlfriend.  States yesterday he was upset because he has not had any type of sexual relation ship with his girlfriend in the last several weeks states "she is always making excuses or going to bed early know I can't wake her  and I got really frustrated and started banging my head on the wall.  I won't trying to kill myself; that's just self harm you know like a teenager cutting their self.  That just how I deal. I don't want to die.  But I;m moving out; I'm going to ask my brother if I can come stay with him."  Patein states he does have a psychiatric history but no services at this ti  During evaluation James Green is alert/oriented x 4; calm but agitated; and mood is congruent with affect.  He does not appear to be responding to internal/external stimuli or delusional thoughts.  Patient denies suicidal/self-harm/homicidal ideation, psychosis, and paranoia.   Past Psychiatric History: MDD, PTSD  Risk to Self:   Yes Risk to Others:  No Prior Inpatient Therapy:  Yes Prior Outpatient Therapy:  Yes  Past Medical History: No past medical history on file. No past surgical history on file. Family History: No family history on file. Family Psychiatric  History: Unaware Social History:  Social History   Substance and Sexual Activity  Alcohol Use Yes  . Alcohol/week: 2.0 standard drinks  . Types: 2 Cans of beer per week   Comment: ocassionaly      Social History   Substance and Sexual Activity  Drug Use Not Currently    Social History   Socioeconomic History  . Marital status: Single    Spouse name: Not on file  . Number of children: Not on file  .  Years of education: Not on file  . Highest education level: Not on file  Occupational History  . Not on file  Tobacco Use  . Smoking status: Current Every Day Smoker    Packs/day: 0.50  . Smokeless tobacco: Never Used  Substance and Sexual Activity  . Alcohol use: Yes    Alcohol/week: 2.0 standard drinks    Types: 2 Cans of beer per week    Comment: ocassionaly   . Drug use: Not Currently  . Sexual activity: Not Currently  Other Topics Concern  . Not on file  Social History Narrative  . Not on file   Social Determinants of Health    Financial Resource Strain:   . Difficulty of Paying Living Expenses: Not on file  Food Insecurity:   . Worried About Programme researcher, broadcasting/film/video in the Last Year: Not on file  . Ran Out of Food in the Last Year: Not on file  Transportation Needs:   . Lack of Transportation (Medical): Not on file  . Lack of Transportation (Non-Medical): Not on file  Physical Activity:   . Days of Exercise per Week: Not on file  . Minutes of Exercise per Session: Not on file  Stress:   . Feeling of Stress : Not on file  Social Connections:   . Frequency of Communication with Friends and Family: Not on file  . Frequency of Social Gatherings with Friends and Family: Not on file  . Attends Religious Services: Not on file  . Active Member of Clubs or Organizations: Not on file  . Attends Banker Meetings: Not on file  . Marital Status: Not on file   Additional Social History:    Allergies:  No Known Allergies  Labs:  Results for orders placed or performed during the hospital encounter of 11/08/19 (from the past 48 hour(s))  Respiratory Panel by RT PCR (Flu A&B, Covid) - Nasopharyngeal Swab     Status: None   Collection Time: 11/08/19  7:28 PM   Specimen: Nasopharyngeal Swab  Result Value Ref Range   SARS Coronavirus 2 by RT PCR NEGATIVE NEGATIVE    Comment: (NOTE) SARS-CoV-2 target nucleic acids are NOT DETECTED.  The SARS-CoV-2 RNA is generally detectable in upper respiratoy specimens during the acute phase of infection. The lowest concentration of SARS-CoV-2 viral copies this assay can detect is 131 copies/mL. A negative result does not preclude SARS-Cov-2 infection and should not be used as the sole basis for treatment or other patient management decisions. A negative result may occur with  improper specimen collection/handling, submission of specimen other than nasopharyngeal swab, presence of viral mutation(s) within the areas targeted by this assay, and inadequate number of viral  copies (<131 copies/mL). A negative result must be combined with clinical observations, patient history, and epidemiological information. The expected result is Negative.  Fact Sheet for Patients:  https://www.moore.com/  Fact Sheet for Healthcare Providers:  https://www.young.biz/  This test is no t yet approved or cleared by the [place] FDA and  has been authorized for detection and/or diagnosis of SARS-CoV-2 by FDA under an Emergency Use Authorization (EUA). This EUA will remain  in effect (meaning this test can be used) for the duration of the COVID-19 declaration under Section 564(b)(1) of the Act, 21 U.S.C. section 360bbb-3(b)(1), unless the authorization is terminated or revoked sooner.     Influenza A by PCR NEGATIVE NEGATIVE   Influenza B by PCR NEGATIVE NEGATIVE    Comment: (NOTE) The Xpert Xpress  SARS-CoV-2/FLU/RSV assay is intended as an aid in  the diagnosis of influenza from Nasopharyngeal swab specimens and  should not be used as a sole basis for treatment. Nasal washings and  aspirates are unacceptable for Xpert Xpress SARS-CoV-2/FLU/RSV  testing.  Fact Sheet for Patients: https://www.moore.com/  Fact Sheet for Healthcare Providers: https://www.young.biz/  This test is not yet approved or cleared by the [place] FDA and  has been authorized for detection and/or diagnosis of SARS-CoV-2 by  FDA under an Emergency Use Authorization (EUA). This EUA will remain  in effect (meaning this test can be used) for the duration of the  Covid-19 declaration under Section 564(b)(1) of the Act, 21  U.S.C. section 360bbb-3(b)(1), unless the authorization is  terminated or revoked. Performed at [facility], [address] [place], [place] [address]   Comprehensive metabolic panel     Status: Abnormal   Collection Time: 11/08/19  7:28 PM  Result Value Ref Range    Sodium 141 135 - 145 mmol/L   Potassium 3.8 3.5 - 5.1 mmol/L   Chloride 101 98 - 111 mmol/L   CO2 26 22 - 32 mmol/L   Glucose, Bld 89 70 - 99 mg/dL    Comment: Glucose reference range applies only to samples taken after fasting for at least 8 hours.   BUN 13 6 - 20 mg/dL   Creatinine, Ser 6.04 (H) 0.61 - 1.24 mg/dL   Calcium 9.9 8.9 - 54.0 mg/dL   Total Protein 8.0 6.5 - 8.1 g/dL   Albumin 4.4 3.5 - 5.0 g/dL   AST 23 15 - 41 U/L   ALT 22 0 - 44 U/L   Alkaline Phosphatase 58 38 - 126 U/L   Total Bilirubin 1.0 0.3 - 1.2 mg/dL   GFR, Estimated >98 >11 mL/min   Anion gap 14 5 - 15    Comment: Performed at [facility], [address], [place], [place] [address]  Ethanol     Status: None   Collection Time: 11/08/19  7:28 PM  Result Value Ref Range   Alcohol, Ethyl (B) <10 <10 mg/dL    Comment: (NOTE) Lowest detectable limit for serum alcohol is 10 mg/dL.  For medical purposes only. Performed at [facility], [address], [place], [place] [address]   Urine rapid drug screen (hosp performed)     Status: Abnormal   Collection Time: 11/08/19  7:28 PM  Result Value Ref Range   Opiates NONE DETECTED NONE DETECTED   Cocaine NONE DETECTED NONE DETECTED   Benzodiazepines NONE DETECTED NONE DETECTED   Amphetamines NONE DETECTED NONE DETECTED   Tetrahydrocannabinol POSITIVE (A) NONE DETECTED   Barbiturates NONE DETECTED NONE DETECTED    Comment: (NOTE) DRUG SCREEN FOR MEDICAL PURPOSES ONLY.  IF CONFIRMATION IS NEEDED FOR ANY PURPOSE, NOTIFY LAB WITHIN 5 DAYS.  LOWEST DETECTABLE LIMITS FOR URINE DRUG SCREEN Drug Class                     Cutoff (ng/mL) Amphetamine and metabolites    1000 Barbiturate and metabolites    200 Benzodiazepine                 200 Tricyclics and metabolites     300 Opiates and metabolites        300 Cocaine and metabolites        300 THC  50 Performed at [facility],  [address] [place], [place] [address]   CBC with Diff     Status: None   Collection Time: 11/08/19  7:28 PM  Result Value Ref Range   WBC 8.4 4.0 - 10.5 K/uL   RBC 5.01 4.22 - 5.81 MIL/uL   Hemoglobin 15.9 13.0 - 17.0 g/dL   HCT 56.8 39 - 52 %   MCV 92.8 80.0 - 100.0 fL   MCH 31.7 26.0 - 34.0 pg   MCHC 34.2 30.0 - 36.0 g/dL   RDW 12.7 51.7 - 00.1 %   Platelets 233 150 - 400 K/uL   nRBC 0.0 0.0 - 0.2 %   Neutrophils Relative % 55 %   Neutro Abs 4.7 1.7 - 7.7 K/uL   Lymphocytes Relative 34 %   Lymphs Abs 2.9 0.7 - 4.0 K/uL   Monocytes Relative 8 %   Monocytes Absolute 0.6 0.1 - 1.0 K/uL   Eosinophils Relative 1 %   Eosinophils Absolute 0.1 0 - 0 K/uL   Basophils Relative 1 %   Basophils Absolute 0.0 0 - 0 K/uL   Immature Granulocytes 1 %   Abs Immature Granulocytes 0.05 0.00 - 0.07 K/uL   Reactive, Benign Lymphocytes PRESENT     Comment: Performed at [facility], [address], [place], [place] [address]    Medications:  Current Facility-Administered Medications  Medication Dose Route Frequency Provider Last Rate Last Admin  . acetaminophen (TYLENOL) tablet 1,000 mg  1,000 mg Oral Q6H PRN [name], MD      . acetaminophen (TYLENOL) tablet 650 mg  650 mg Oral Once [name], MD      . alum & mag hydroxide-simeth (MAALOX/MYLANTA) 200-200-20 MG/5ML suspension 30 mL  30 mL Oral Q6H PRN [name], MD      . benztropine mesylate (COGENTIN) injection 0.5 mg  0.5 mg Intramuscular BID Anubis Fundora B, NP      . LORazepam (ATIVAN) tablet 1 mg  1 mg Oral Q6H PRN [name], MD   1 mg at 11/09/19 1059  . LORazepam (ATIVAN) tablet 1 mg  1 mg Oral Once Jlyn Bracamonte B, NP      . ondansetron (ZOFRAN) tablet 4 mg  4 mg Oral Q8H PRN [name], MD      . risperiDONE (RISPERDAL) tablet 1 mg  1 mg Oral TID Shianna Bally B, NP       Current Outpatient Medications  Medication Sig Dispense Refill  . acetaminophen (TYLENOL) 500 MG tablet Take 1,000 mg by mouth  every 6 (six) hours as needed for headache.    . risperiDONE (RISPERDAL) 1 MG tablet Take 1 tablet (1 mg total) by mouth 2 (two) times daily. 60 tablet 1    Musculoskeletal: Strength & Muscle Tone: within normal limits Gait & Station: normal Patient leans: N/A  Psychiatric Specialty Exam: Physical Exam Constitutional:      Appearance: Normal appearance.  Pulmonary:     Effort: Pulmonary effort is normal.  Musculoskeletal:        General: Normal range of motion.     Cervical back: Normal range of motion.  Neurological:     Mental Status: He is alert and oriented to person, place, and time.  Psychiatric:        Attention and Perception: He perceives auditory hallucinations.        Mood and Affect: Mood is anxious and depressed. Affect is labile and angry.  Speech: Speech is rapid and pressured.        Behavior: Behavior is agitated. Behavior is cooperative.        Thought Content: Thought content is not paranoid or delusional. Thought content does not include homicidal or suicidal ideation.        Cognition and Memory: Cognition and memory normal.        Judgment: Judgment is impulsive.     Review of Systems  Psychiatric/Behavioral: Positive for agitation, behavioral problems, hallucinations and self-injury. Negative for suicidal ideas.  All other systems reviewed and are negative.   Blood pressure 100/85, pulse (!) 58, temperature 98.6 F (37 C), resp. rate 16, SpO2 98 %.There is no height or weight on file to calculate BMI.  General Appearance: Casual  Eye Contact:  Good  Speech:  Clear and Coherent and Normal Rate  Volume:  Normal  Mood:  Angry, Anxious and Irritable  Affect:  Congruent and Labile  Thought Process:  Coherent, Goal Directed, Linear and Descriptions of Associations: Intact  Orientation:  Full (Time, Place, and Person)  Thought Content:  WDL  Suicidal Thoughts:  No  Homicidal Thoughts:  No  Memory:  Immediate;   Good Recent;   Good  Judgement:   Fair  Insight:  Fair  Psychomotor Activity:  Normal  Concentration:  Concentration: Fair and Attention Span: Fair  Recall:  Good  Fund of Knowledge:  Fair  Language:  Good  Akathisia:  No  Handed:  Right  AIMS (if indicated):     Assets:  Communication Skills Desire for Improvement Housing Social Support  ADL's:  Intact  Cognition:  WNL  Sleep:        Treatment Plan Summary: Daily contact with patient to assess and evaluate symptoms and progress in treatment, Medication management and Plan Inpatient psychiatric treatment  Disposition: Recommend psychiatric Inpatient admission when medically cleared.  This service was provided via telemedicine using a 2-way, interactive audio and video technology.  Names of all persons participating in this telemedicine service and their role in this encounter. Name: Assunta Found Role: NP  Name: Dr. Lucianne Muss Role: Psychiatrist  Name: James Green Role: Patient  Name:  Role:     Assunta Found, NP 11/09/2019 4:06 PM

## 2019-11-09 NOTE — ED Notes (Signed)
Provider made aware of patient's behavior.

## 2019-11-09 NOTE — ED Notes (Signed)
Pt alert, upset, pt hearing voices. Medication compliant.

## 2019-11-09 NOTE — Progress Notes (Signed)
Notified by security that patient had arrived in the custody of [employer].  They were reporting that the medication he had received prior to leaving the sending facility was "wearing off". I responded to the intake area and assessment of patient was:  Patient was talking to someone that he indicated was in the corner of the room (no one was there).  He was also carrying on a second conversation to "a voice that is inside my head".  He began rocking and holding his head, saying "Please make it stop" over and over.  He would then alternate with clenching his fists and tensing with posturing.  He was only able to answer a couple of questions due to responding mostly to internal stimuli and visual hallucinations.   Provider was notified with medication orders given and then administered immediately.  Patient was escorted by five staff members to the [facility] unit and skin search was completed there. He began crying and just wanted the "pain in his head" to "go away" so he could "have just a minute of peace".  He started referring to himself as "[name]".    Patient stated, "I promise you I don't want to hurt anybody. I swear I don't but I can't promise you that I won't.  I am trying so hard."  Patient began to visibly relax about fifteen minutes after medication administration and thanked the staff for "trying to help" him.

## 2019-11-09 NOTE — ED Provider Notes (Signed)
Emergency Medicine Observation Re-evaluation Note  James Green is a 34 y.o. male, seen on rounds today.  Pt initially presented to the ED for complaints of Psychiatric Evaluation Currently, the patient is pt intermittently angry and yelling but redirectable and cooperative.  Physical Exam  BP 100/85   Pulse (!) 58   Temp 98.6 F (37 C)   Resp 16   SpO2 98%  Physical Exam General: intermittently agitated Cardiac: RRR Lungs: no resp distress Psych: SI and depressed  ED Course / MDM  EKG:    I have reviewed the labs performed to date as well as medications administered while in observation.  Recent changes in the last 24 hours include none.  Plan  Current plan is for pt still waiting for psych evaluation. Patient is under full IVC at this time.   Gwyneth Sprout, MD 11/09/19 1616

## 2019-11-09 NOTE — ED Notes (Signed)
Patient stating the he is multiple people. He states, "we are [place]."
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
## 2019-11-09 NOTE — ED Notes (Signed)
Patient getting frustrated from TSS consult and speaking about his wife. Patient hitting himself in the head. Security called and meds given. Patient now laying in bed watching t.v. Sitter at bedside. Charge nurse made aware. Jeraldine Loots, EDP made aware.

## 2019-11-09 NOTE — BH Assessment (Addendum)
[facility] Assessment Progress Note  Per Shuvon Rankin, NP, this pt requires psychiatric hospitalization.  Heather, RN has assigned pt to [facility] Rm [facility]; [facility] will be ready to receive pt at 20:00.  Pt presents under IVC initiated by Hillery Jacks, NP, and IVC documents have been faxed to [facility].  EDP Benjiman Core, MD and pt's nurse, Waynetta Sandy, have been notified, and Waynetta Sandy agrees to call report to [phone].  Pt is to be transported via [employer].   Doylene Canning, [place] Behavioral Health Coordinator [phone]

## 2019-11-09 NOTE — ED Notes (Signed)
Returned from CT.

## 2019-11-09 NOTE — ED Notes (Signed)
Sitter at bedside.

## 2019-11-10 ENCOUNTER — Other Ambulatory Visit: Payer: Self-pay

## 2019-11-10 ENCOUNTER — Encounter (HOSPITAL_COMMUNITY): Payer: Self-pay | Admitting: Registered Nurse

## 2019-11-10 DIAGNOSIS — F29 Unspecified psychosis not due to a substance or known physiological condition: Secondary | ICD-10-CM

## 2019-11-10 MED ORDER — HALOPERIDOL LACTATE 5 MG/ML IJ SOLN: 10.0000 mg | Freq: Four times a day (QID) | INTRAMUSCULAR | Status: DC | PRN

## 2019-11-10 MED ORDER — HALOPERIDOL 5 MG PO TABS: 5.0000 mg | ORAL_TABLET | Freq: Four times a day (QID) | ORAL | Status: DC | PRN

## 2019-11-10 MED ORDER — HALOPERIDOL LACTATE 5 MG/ML IJ SOLN: 5.0000 mg | Freq: Four times a day (QID) | INTRAMUSCULAR | Status: DC | PRN

## 2019-11-10 MED ORDER — LORAZEPAM 1 MG PO TABS
2.0000 mg | ORAL_TABLET | Freq: Four times a day (QID) | ORAL | Status: DC | PRN
  Administered 2019-11-11 – 2019-11-12 (×3): 2 mg via ORAL
  Filled 2019-11-10 (×3): qty 2

## 2019-11-10 MED ORDER — ZIPRASIDONE MESYLATE 20 MG IM SOLR: 20.0000 mg | Freq: Four times a day (QID) | INTRAMUSCULAR | Status: DC | PRN

## 2019-11-10 MED ORDER — RISPERIDONE 1 MG PO TABS
1.0000 mg | ORAL_TABLET | Freq: Every day | ORAL | Status: DC
  Administered 2019-11-11: 1 mg via ORAL
  Filled 2019-11-10 (×3): qty 1

## 2019-11-10 MED ORDER — RISPERIDONE 3 MG PO TBDP
3.0000 mg | ORAL_TABLET | Freq: Every day | ORAL | Status: DC
  Administered 2019-11-10: 3 mg via ORAL
  Filled 2019-11-10 (×3): qty 1

## 2019-11-10 MED ORDER — LORAZEPAM 2 MG/ML IJ SOLN: 2.0000 mg | Freq: Four times a day (QID) | INTRAMUSCULAR | Status: DC | PRN

## 2019-11-10 MED ORDER — RISPERIDONE 2 MG PO TBDP
2.0000 mg | ORAL_TABLET | Freq: Three times a day (TID) | ORAL | Status: DC | PRN
  Administered 2019-11-10: 2 mg via ORAL
  Filled 2019-11-10: qty 1

## 2019-11-10 MED ORDER — LORAZEPAM 1 MG PO TABS
1.0000 mg | ORAL_TABLET | Freq: Four times a day (QID) | ORAL | Status: AC | PRN
  Administered 2019-11-10: 1 mg via ORAL
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
## 2019-11-10 NOTE — Tx Team (Signed)
Initial Treatment Plan 11/10/2019 5:47 AM [place] PJP:[id number]    PATIENT STRESSORS: Legal issue Medication change or noncompliance Substance abuse Traumatic event   PATIENT STRENGTHS: Communication skills Motivation for treatment/growth Supportive family/friends   PATIENT IDENTIFIED PROBLEMS:   Medication non compliance   Psychosis  Command Auditory Hallucinations               DISCHARGE CRITERIA:  Improved stabilization in mood, thinking, and/or behavior Motivation to continue treatment in a less acute level of care Reduction of life-threatening or endangering symptoms to within safe limits Verbal commitment to aftercare and medication compliance  PRELIMINARY DISCHARGE PLAN: Outpatient therapy Return to previous living arrangement  PATIENT/FAMILY INVOLVEMENT: This treatment plan has been presented to and reviewed with the patient, James Green.  The patient and family have been given the opportunity to ask questions and make suggestions.  Margarita Rana, RN 11/10/2019, 5:47 AM

## 2019-11-10 NOTE — Progress Notes (Signed)
Dar Note: Patient presents with blunted affect and mood.  Observed responding to internal stimuli while in the Cafeteria.  Medication given as prescribed.  Denies suicidal thoughts and visual hallucination. Routine safety checks maintained.  Patient visible in milieu and pacing the hallway at times.  Minimal interaction with staff and peers.  Patient is safe on and off the unit.

## 2019-11-10 NOTE — Progress Notes (Signed)
Pt stated he felt tired this evening due to the medication, pt educated on the medication and encouraged to inform the doctor / staff of any changes good or bad    11/10/19 2200  Psych Admission Type (Psych Patients Only)  Admission Status Involuntary  Psychosocial Assessment  Patient Complaints Suspiciousness  Eye Contact Brief  Facial Expression Flat  Affect Blunted  Speech Soft  Interaction Minimal  Motor Activity Slow  Appearance/Hygiene Disheveled  Behavior Characteristics Cooperative  Mood Preoccupied  Thought Process  Coherency WDL  Content Preoccupation  Delusions None reported or observed  Perception Hallucinations  Hallucination Auditory  Judgment Poor  Confusion None  Danger to Self  Current suicidal ideation? Denies  Danger to Others  Danger to Others None reported or observed
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
## 2019-11-11 DIAGNOSIS — F29 Unspecified psychosis not due to a substance or known physiological condition: Secondary | ICD-10-CM | POA: Diagnosis not present

## 2019-11-11 MED ORDER — RISPERIDONE 2 MG PO TBDP
4.0000 mg | ORAL_TABLET | Freq: Every day | ORAL | Status: DC
  Administered 2019-11-11 – 2019-11-12 (×2): 4 mg via ORAL
  Filled 2019-11-11 (×4): qty 2

## 2019-11-11 MED ORDER — HALOPERIDOL 5 MG PO TABS
ORAL_TABLET | ORAL | Status: AC
  Filled 2019-11-11: qty 2

## 2019-11-11 MED ORDER — RISPERIDONE 2 MG PO TABS
2.0000 mg | ORAL_TABLET | Freq: Every day | ORAL | Status: DC
  Administered 2019-11-12: 2 mg via ORAL
  Filled 2019-11-11 (×2): qty 1

## 2019-11-11 MED ORDER — RISPERIDONE 3 MG PO TBDP: 3.0000 mg | ORAL_TABLET | Freq: Three times a day (TID) | ORAL | Status: DC | PRN

## 2019-11-11 MED ORDER — CARBAMAZEPINE 100 MG PO CHEW
200.0000 mg | CHEWABLE_TABLET | Freq: Two times a day (BID) | ORAL | Status: DC
  Filled 2019-11-11 (×5): qty 2

## 2019-11-11 MED ORDER — ZIPRASIDONE MESYLATE 20 MG IM SOLR: 20.0000 mg | Freq: Four times a day (QID) | INTRAMUSCULAR | Status: DC | PRN

## 2019-11-11 MED ORDER — HALOPERIDOL LACTATE 5 MG/ML IJ SOLN: 10.0000 mg | Freq: Three times a day (TID) | INTRAMUSCULAR | Status: DC | PRN

## 2019-11-11 MED ORDER — HALOPERIDOL 5 MG PO TABS
10.0000 mg | ORAL_TABLET | Freq: Three times a day (TID) | ORAL | Status: DC | PRN
  Administered 2019-11-11: 10 mg via ORAL

## 2019-11-11 MED ORDER — CARBAMAZEPINE 100 MG PO CHEW
200.0000 mg | CHEWABLE_TABLET | Freq: Three times a day (TID) | ORAL | Status: DC
  Administered 2019-11-11 – 2019-11-12 (×3): 200 mg via ORAL
  Filled 2019-11-11 (×8): qty 2

## 2019-11-11 NOTE — Progress Notes (Signed)
[facility] MD Progress Note  11/11/2019 1:03 PM James Green  MRN:  [id number] Subjective: Patient is a 34 year old male with an unspecified past psychiatric history who presented to the [facility] on 11/08/2019 because his girlfriend told him to go to the health center.  He apparently had been having hallucinations and anger management problems.  Objective: Patient is seen and examined.  Patient is a 34 year old male with the above-stated past psychiatric history who is seen in follow-up.  He is doing better today.  Yesterday was fairly sedated.  He stated that he is not having any problems with the medications, and that the voices that he is hearing have decreased significantly.  He stated the voices get worse and his anger gets worse when his significant other's children are running around their dwelling making noise and banging things.  He stated he has a hard time controlling things at that time.  He does have a reported history of posttraumatic stress disorder, and it may be related to that.  He denied any suicidal or homicidal ideation today.  His vital signs are stable, he is afebrile.  No new laboratories.  He slept 6 hours last night.  He admitted to marijuana usage and stated that he uses it at home because that keeps him from getting upset around the children.  Principal Problem: <principal problem not specified> Diagnosis: Active Problems:   Psychotic disorder (HCC)  Total Time spent with patient: 20 minutes  Past Psychiatric History: See admission H&P  Past Medical History: History reviewed. No pertinent past medical history. History reviewed. No pertinent surgical history. Family History: History reviewed. No pertinent family history. Family Psychiatric  History: See admission H&P Social History:  Social History   Substance and Sexual Activity  Alcohol Use Yes  . Alcohol/week: 2.0 standard drinks  . Types: 2 Cans of beer per week   Comment:  ocassionaly      Social History   Substance and Sexual Activity  Drug Use Not Currently    Social History   Socioeconomic History  . Marital status: Single    Spouse name: Not on file  . Number of children: Not on file  . Years of education: Not on file  . Highest education level: Not on file  Occupational History  . Not on file  Tobacco Use  . Smoking status: Current Every Day Smoker    Packs/day: 0.50  . Smokeless tobacco: Never Used  Substance and Sexual Activity  . Alcohol use: Yes    Alcohol/week: 2.0 standard drinks    Types: 2 Cans of beer per week    Comment: ocassionaly   . Drug use: Not Currently  . Sexual activity: Not Currently  Other Topics Concern  . Not on file  Social History Narrative  . Not on file   Social Determinants of Health   Financial Resource Strain:   . Difficulty of Paying Living Expenses: Not on file  Food Insecurity:   . Worried About Programme researcher, broadcasting/film/video in the Last Year: Not on file  . Ran Out of Food in the Last Year: Not on file  Transportation Needs:   . Lack of Transportation (Medical): Not on file  . Lack of Transportation (Non-Medical): Not on file  Physical Activity:   . Days of Exercise per Week: Not on file  . Minutes of Exercise per Session: Not on file  Stress:   . Feeling of Stress : Not on file  Social Connections:   .  Frequency of Communication with Friends and Family: Not on file  . Frequency of Social Gatherings with Friends and Family: Not on file  . Attends Religious Services: Not on file  . Active Member of Clubs or Organizations: Not on file  . Attends Banker Meetings: Not on file  . Marital Status: Not on file   Additional Social History:                         Sleep: Good  Appetite:  Good  Current Medications: Current Facility-Administered Medications  Medication Dose Route Frequency Provider Last Rate Last Admin  . acetaminophen (TYLENOL) tablet 650 mg  650 mg Oral Q6H PRN  Rankin, Shuvon B, NP      . alum & mag hydroxide-simeth (MAALOX/MYLANTA) 200-200-20 MG/5ML suspension 30 mL  30 mL Oral Q4H PRN Rankin, Shuvon B, NP      . haloperidol (HALDOL) tablet 5 mg  5 mg Oral Q6H PRN Antonieta Pert, MD      . LORazepam (ATIVAN) tablet 2 mg  2 mg Oral Q6H PRN Antonieta Pert, MD      . magnesium hydroxide (MILK OF MAGNESIA) suspension 30 mL  30 mL Oral Daily PRN Rankin, Shuvon B, NP      . ondansetron (ZOFRAN) tablet 4 mg  4 mg Oral Q8H PRN Rankin, Shuvon B, NP      . risperiDONE (RISPERDAL M-TABS) disintegrating tablet 2 mg  2 mg Oral Q8H PRN Antonieta Pert, MD   2 mg at 11/10/19 1457   And  . ziprasidone (GEODON) injection 20 mg  20 mg Intramuscular Q6H PRN Antonieta Pert, MD      . risperiDONE (RISPERDAL M-TABS) disintegrating tablet 3 mg  3 mg Oral QHS Antonieta Pert, MD   3 mg at 11/10/19 2052  . risperiDONE (RISPERDAL) tablet 1 mg  1 mg Oral Daily Antonieta Pert, MD   1 mg at 11/11/19 1117    Lab Results: No results found for this or any previous visit (from the past 48 hour(s)).  Blood Alcohol level:  Lab Results  Component Value Date   ETH <10 11/08/2019    Metabolic Disorder Labs: No results found for: HGBA1C, MPG No results found for: PROLACTIN No results found for: CHOL, TRIG, HDL, CHOLHDL, VLDL, LDLCALC  Physical Findings: AIMS:  , ,  ,  ,    CIWA:    COWS:     Musculoskeletal: Strength & Muscle Tone: within normal limits Gait & Station: normal Patient leans: N/A  Psychiatric Specialty Exam: Physical Exam Vitals and nursing note reviewed.  Constitutional:      Appearance: Normal appearance.  HENT:     Head: Normocephalic and atraumatic.  Pulmonary:     Effort: Pulmonary effort is normal.  Neurological:     General: No focal deficit present.     Mental Status: He is alert and oriented to person, place, and time.     Review of Systems  Blood pressure 121/82, pulse 72, temperature 98.4 F (36.9 C), temperature  source Oral, resp. rate 16, SpO2 99 %.There is no height or weight on file to calculate BMI.  General Appearance: Casual  Eye Contact:  Fair  Speech:  Normal Rate  Volume:  Increased  Mood:  Anxious and Dysphoric  Affect:  Congruent  Thought Process:  Coherent and Descriptions of Associations: Loose  Orientation:  Full (Time, Place, and Person)  Thought Content:  Delusions and Hallucinations: Auditory  Suicidal Thoughts:  No  Homicidal Thoughts:  No  Memory:  Immediate;   Fair Recent;   Fair Remote;   Fair  Judgement:  Intact  Insight:  Fair  Psychomotor Activity:  Normal  Concentration:  Concentration: Fair and Attention Span: Fair  Recall:  [employer] of Knowledge:  Fair  Language:  Fair  Akathisia:  Negative  Handed:  Right  AIMS (if indicated):     Assets:  Desire for Improvement Housing Resilience Social Support  ADL's:  Intact  Cognition:  WNL  Sleep:  Number of Hours: 6     Treatment Plan Summary: Daily contact with patient to assess and evaluate symptoms and progress in treatment, Medication management and Plan : Patient is seen and examined.  Patient is a 34 year old male with the above-stated past psychiatric history who is seen in follow-up.   Diagnosis: 1.  Bipolar disorder type II 2.  Posttraumatic stress disorder 3.  Cannabis use disorder 4.  Unspecified psychosis  Pertinent findings on examination today: 1.  Decreased auditory hallucinations. 2.  Decreased anger. 3.  More stable mood. 4.  Improved sleep.  Plan: 1.  Continue Haldol 5 mg p.o. every 6 hours as needed agitation. 2.  Continue lorazepam 2 mg p.o. every 6 hours as needed agitation. 3.  Continue Zofran 4 mg p.o. every 8 hours as needed nausea. 4.  Continue Risperdal agitation protocol as needed. 5.  Add Tegretol 200 mg p.o. twice daily for mood stability and anger management. 6.  Continue Risperdal 1 mg p.o. daily and 3 mg p.o. nightly for psychosis and mood stability. 7.  Disposition  planning-in progress.  Antonieta Pert, MD 11/11/2019, 1:03 PM

## 2019-11-11 NOTE — Progress Notes (Signed)
Patient ID: James Green, male   DOB: July 08, 1985, 34 y.o.   MRN: [id number] DAR Note:  Patient was agitated earlier in shift when another patient on the unit began using profanity in the day room where it was audible to every one in the day room.  This pt began rocking back and forth and then lashed out loudly, crying and stomping down the hall to his room with clenched fits, and uttering: "I knew she was going to make me do this! I knew she was going to get to me".  Orders were given by Dr Jola Babinski, and pt was medicated with Haldol 10mg  for agitation and Ativan 2mg  at 1444 for agitation as well.  Pt was given active listening and thanked for walking away to his room, and was able to regain control of himself.  Q15 minute checks for safety maintained through shift, report given to oncoming shift.  [place] NOVEL CORONAVIRUS (COVID-19) DAILY CHECK-OFF SYMPTOMS - answer yes or no to each - every day NO YES  Have you had a fever in the past 24 hours?  . Fever (Temp > 37.80C / 100F) X   Have you had any of these symptoms in the past 24 hours? . New Cough .  Sore Throat  .  Shortness of Breath .  Difficulty Breathing .  Unexplained Body Aches   X   Have you had any one of these symptoms in the past 24 hours not related to allergies?   . Runny Nose .  Nasal Congestion .  Sneezing   X   If you have had runny nose, nasal congestion, sneezing in the past 24 hours, has it worsened?  X   EXPOSURES - check yes or no X   Have you traveled outside the state in the past 14 days?  X   Have you been in contact with someone with a confirmed diagnosis of COVID-19 or PUI in the past 14 days without wearing appropriate PPE?  X   Have you been living in the same home as a person with confirmed diagnosis of COVID-19 or a PUI (household contact)?    X   Have you been diagnosed with COVID-19?    X              What to do next: Answered NO to all: Answered YES to anything:   Proceed with unit schedule  Follow the [facility] Inpatient Flowsheet.

## 2019-11-11 NOTE — [facility] Suicide Risk Assessment (Signed)
[facility] INPATIENT:  Family/Significant Other Suicide Prevention Education  Suicide Prevention Education: Education Completed; Significant other,girlfriend Philomena Doheny [phone]), has been identified by the patient as the family member/significant other with whom the patient will be residing, and identified as the person(s) who will aid the patient in the event of a mental health crisis (suicidal ideations/suicide attempt).  With written consent from the patient, the family member/significant other has been provided the following suicide prevention education, prior to the and/or following the discharge of the patient.   CSW spoke with this patient s/o who reported that she had no safety concerns with this patient returning home. She stated that she had called mobile crisis and they were able to calm this patient down at the time, however felt he had needed to come to have his head checked.    Ruthann Cancer MSW, LCSW Clincal Social Worker  [facility]

## 2019-11-11 NOTE — Tx Team (Signed)
Interdisciplinary Treatment and Diagnostic Plan Update  11/11/2019  Time of Session: 9:30am James Green MRN: [id number]  Principal Diagnosis: <principal problem not specified>  Secondary Diagnoses: Active Problems:   Psychotic disorder ([place])   Current Medications:  Current Facility-Administered Medications  Medication Dose Route Frequency Provider Last Rate Last Admin  . acetaminophen (TYLENOL) tablet 650 mg  650 mg Oral Q6H PRN Rankin, Shuvon B, NP      . alum & mag hydroxide-simeth (MAALOX/MYLANTA) 200-200-20 MG/5ML suspension 30 mL  30 mL Oral Q4H PRN Rankin, Shuvon B, NP      . haloperidol (HALDOL) tablet 5 mg  5 mg Oral Q6H PRN Sharma Covert, MD      . LORazepam (ATIVAN) tablet 2 mg  2 mg Oral Q6H PRN Sharma Covert, MD      . magnesium hydroxide (MILK OF MAGNESIA) suspension 30 mL  30 mL Oral Daily PRN Rankin, Shuvon B, NP      . ondansetron (ZOFRAN) tablet 4 mg  4 mg Oral Q8H PRN Rankin, Shuvon B, NP      . risperiDONE (RISPERDAL M-TABS) disintegrating tablet 2 mg  2 mg Oral Q8H PRN Sharma Covert, MD   2 mg at 11/10/19 1457   And  . ziprasidone (GEODON) injection 20 mg  20 mg Intramuscular Q6H PRN Sharma Covert, MD      . risperiDONE (RISPERDAL M-TABS) disintegrating tablet 3 mg  3 mg Oral QHS Sharma Covert, MD   3 mg at 11/10/19 2052  . risperiDONE (RISPERDAL) tablet 1 mg  1 mg Oral Daily Clary, Cordie Grice, MD       PTA Medications: Medications Prior to Admission  Medication Sig Dispense Refill Last Dose  . acetaminophen (TYLENOL) 500 MG tablet Take 1,000 mg by mouth every 6 (six) hours as needed for headache.     . Aspirin-Salicylamide-Caffeine (BC HEADACHE POWDER PO) Take 1 each by mouth daily as needed (headache).     . risperiDONE (RISPERDAL) 1 MG tablet Take 1 tablet (1 mg total) by mouth 2 (two) times daily. 60 tablet 1     Patient Stressors: Legal issue Medication change or noncompliance Substance abuse Traumatic event  Patient  Strengths: Agricultural engineer for treatment/growth Supportive family/friends  Treatment Modalities: Medication Management, Group therapy, Case management,  1 to 1 session with clinician, Psychoeducation, Recreational therapy.   Physician Treatment Plan for Primary Diagnosis: <principal problem not specified> Long Term Goal(s): Improvement in symptoms so as ready for discharge Improvement in symptoms so as ready for discharge   Short Term Goals: Ability to identify changes in lifestyle to reduce recurrence of condition will improve Ability to verbalize feelings will improve Ability to disclose and discuss suicidal ideas Ability to demonstrate self-control will improve Ability to identify and develop effective coping behaviors will improve Ability to maintain clinical measurements within normal limits will improve Ability to identify triggers associated with substance abuse/mental health issues will improve Ability to identify changes in lifestyle to reduce recurrence of condition will improve Ability to verbalize feelings will improve Ability to disclose and discuss suicidal ideas Ability to demonstrate self-control will improve Ability to identify and develop effective coping behaviors will improve Ability to maintain clinical measurements within normal limits will improve Ability to identify triggers associated with substance abuse/mental health issues will improve  Medication Management: Evaluate patient's response, side effects, and tolerance of medication regimen.  Therapeutic Interventions: 1 to 1 sessions, Unit Group sessions and Medication administration.  Evaluation of  Outcomes: Not Met  Physician Treatment Plan for Secondary Diagnosis: Active Problems:   Psychotic disorder ([place])  Long Term Goal(s): Improvement in symptoms so as ready for discharge Improvement in symptoms so as ready for discharge   Short Term Goals: Ability to identify changes in lifestyle  to reduce recurrence of condition will improve Ability to verbalize feelings will improve Ability to disclose and discuss suicidal ideas Ability to demonstrate self-control will improve Ability to identify and develop effective coping behaviors will improve Ability to maintain clinical measurements within normal limits will improve Ability to identify triggers associated with substance abuse/mental health issues will improve Ability to identify changes in lifestyle to reduce recurrence of condition will improve Ability to verbalize feelings will improve Ability to disclose and discuss suicidal ideas Ability to demonstrate self-control will improve Ability to identify and develop effective coping behaviors will improve Ability to maintain clinical measurements within normal limits will improve Ability to identify triggers associated with substance abuse/mental health issues will improve     Medication Management: Evaluate patient's response, side effects, and tolerance of medication regimen.  Therapeutic Interventions: 1 to 1 sessions, Unit Group sessions and Medication administration.  Evaluation of Outcomes: Not Met   RN Treatment Plan for Primary Diagnosis: <principal problem not specified> Long Term Goal(s): Knowledge of disease and therapeutic regimen to maintain health will improve  Short Term Goals: Ability to remain free from injury will improve, Ability to verbalize frustration and anger appropriately will improve, Ability to identify and develop effective coping behaviors will improve and Compliance with prescribed medications will improve  Medication Management: RN will administer medications as ordered by provider, will assess and evaluate patient's response and provide education to patient for prescribed medication. RN will report any adverse and/or side effects to prescribing provider.  Therapeutic Interventions: 1 on 1 counseling sessions, Psychoeducation, Medication  administration, Evaluate responses to treatment, Monitor vital signs and CBGs as ordered, Perform/monitor CIWA, COWS, AIMS and Fall Risk screenings as ordered, Perform wound care treatments as ordered.  Evaluation of Outcomes: Not Met   LCSW Treatment Plan for Primary Diagnosis: <principal problem not specified> Long Term Goal(s): Safe transition to appropriate next level of care at discharge, Engage patient in therapeutic group addressing interpersonal concerns.  Short Term Goals: Engage patient in aftercare planning with referrals and resources, Increase social support, Increase emotional regulation, Identify triggers associated with mental health/substance abuse issues and Increase skills for wellness and recovery  Therapeutic Interventions: Assess for all discharge needs, 1 to 1 time with Social worker, Explore available resources and support systems, Assess for adequacy in community support network, Educate family and significant other(s) on suicide prevention, Complete Psychosocial Assessment, Interpersonal group therapy.  Evaluation of Outcomes: Not Met   Progress in Treatment: Attending groups: No. Participating in groups: No. Taking medication as prescribed: Yes. Toleration medication: Yes. Family/Significant other contact made: No, will contact:  patients significant other Patient understands diagnosis: Yes. Discussing patient identified problems/goals with staff: Yes. Medical problems stabilized or resolved: Yes. Denies suicidal/homicidal ideation: Yes. Issues/concerns per patient self-inventory: No.   New problem(s) identified: No, Describe:  none  New Short Term/Long Term Goal(s): medication stabilization, elimination of SI thoughts, development of comprehensive mental wellness plan.   Patient Goals:  "Subside the voices"  Discharge Plan or Barriers: Patient recently admitted. CSW will continue to follow and assess for appropriate referrals and possible discharge  planning.   Reason for Continuation of Hospitalization: Depression Hallucinations Medication stabilization  Estimated Length of Stay: 3-5 days  Attendees: Patient: James Green 11/11/2019  Physician: Harriett Sine, NP 11/11/2019   Nursing:  11/11/2019  RN Care Manager: 11/11/2019   Social Worker: Darletta Moll, LCSW 11/11/2019  Recreational Therapist:  11/11/2019   Other:  11/11/2019   Other:  11/11/2019  Other: 11/11/2019       Scribe for Treatment Team: Vassie Moselle, LCSW 11/11/2019 10:12 AM

## 2019-11-11 NOTE — Progress Notes (Signed)
Adult Psychoeducational Group Note  Date:  11/11/2019 Time:  10:28 PM  Group Topic/Focus:  Wrap-Up Group:   The focus of this group is to help patients review their daily goal of treatment and discuss progress on daily workbooks.  Participation Level:  Minimal  Participation Quality:  Appropriate  Affect:  Appropriate  Cognitive:  Appropriate  Insight: Good  Engagement in Group:  Engaged  Modes of Intervention:  Education and Support  Additional Comments:  Patient attended and participated in group tonight. He reports that the day started off good. He had breakfast which he never usually do. He played some gamed with peers. The day got rocky after that, however, he believed he handled it well. He went to his room when then got hectic.   Lita Mains [facility] 11/11/2019, 10:28 PM

## 2019-11-11 NOTE — [facility] Group Notes (Signed)
[facility] LCSW Group Therapy  11/11/2019 2:58 PM   Type of Therapy and Topic: Group Therapy: Self- Care  Type of Therapy:  Psychoeducational Skills  Participation Level:  Active  Participation Quality:  Appropriate, Attentive and Sharing  Affect:  Appropriate  Cognitive:  Appropriate  Insight:  Engaged and Supportive  Engagement in Therapy:  Engaged  Modes of Intervention:  Discussion and Education  Summary of Progress/Problems: Patient attended and participated in group. This patient showed insight into his self care and shared that deep-breathing exercises and meditation worked well for him.   Sadiq Mccauley A Kebin Maye 11/11/2019, 2:58 PM

## 2019-11-11 NOTE — [facility] Group Notes (Signed)
Pt did not attend orientation group.  

## 2019-11-11 NOTE — Progress Notes (Signed)
Pt continues to respond to internal stimuli, but pt presented on the milieu very appropriate. Pt visible in the dayroom some this evening. Pt stated he was having issues with other pts on the unit , but walked away from the situations. Pt commended for his composure , pt stated" I just want to get back to my family"    11/11/19 2300  Psych Admission Type (Psych Patients Only)  Admission Status Involuntary  Psychosocial Assessment  Patient Complaints Anxiety  Eye Contact Brief  Facial Expression Flat  Affect Blunted  Speech Soft  Interaction Minimal  Motor Activity Slow  Appearance/Hygiene Disheveled  Behavior Characteristics Cooperative  Mood Anxious;Pleasant;Preoccupied  Thought Process  Coherency WDL  Content Preoccupation  Delusions None reported or observed  Perception Hallucinations  Hallucination Auditory  Judgment Poor  Confusion None  Danger to Self  Current suicidal ideation? Denies  Danger to Others  Danger to Others None reported or observed

## 2019-11-12 DIAGNOSIS — F29 Unspecified psychosis not due to a substance or known physiological condition: Secondary | ICD-10-CM | POA: Diagnosis not present

## 2019-11-12 MED ORDER — RISPERIDONE 2 MG PO TABS
2.0000 mg | ORAL_TABLET | Freq: Every day | ORAL | Status: DC
  Administered 2019-11-13: 2 mg via ORAL
  Filled 2019-11-12 (×2): qty 1

## 2019-11-12 MED ORDER — CARBAMAZEPINE 100 MG PO CHEW
300.0000 mg | CHEWABLE_TABLET | Freq: Two times a day (BID) | ORAL | Status: DC
  Administered 2019-11-13: 300 mg via ORAL
  Filled 2019-11-12 (×3): qty 3

## 2019-11-12 MED ORDER — RISPERIDONE 1 MG PO TABS
1.0000 mg | ORAL_TABLET | Freq: Every day | ORAL | Status: DC
  Filled 2019-11-12 (×2): qty 1

## 2019-11-12 NOTE — Progress Notes (Signed)
[facility] MD Progress Note  11/12/2019 12:56 PM James Green  MRN:  [id number] Subjective:  Patient is a 34 year old male with an unspecified past psychiatric history who presented to the [facility] on 11/08/2019 because his girlfriend told him to go to the health center.  He apparently had been having hallucinations and anger management problems.  Objective: Patient is seen and examined.  Patient is a 34 year old male with the above-stated past psychiatric history who is seen in follow-up.  He is fairly sedated this morning.  Yesterday when I am visited with the patient he was actually doing well.  Unfortunately later in the afternoon something occurred in the day room which set them off.  He began yelling and storming up and down the hallway screaming "I am about to go off, I am about to go off".  He ended up receiving oral Haldol and I placed him on a standing Haldol dose.  I also increased his Tegretol to 200 mg p.o. 3 times daily.  Review of the nursing notes stated that he does not feel like this environment is conducive to his growth.  He requested to be able to be at home with his children and wife.  He requested discharge.  I told him we were still working with his medications, and if he went off and did something like this at home it might endanger his wife or his children.  His vital signs are stable, he is afebrile.  He slept 7.25 hours last night.  He denied any auditory or visual hallucinations.  He denied any suicidal or homicidal ideation.  He did state that what set him off was an event involving another patient who was yelling and being obscene towards their mother.  The patient stated he did not have a mother in the past, and that upset him and did not believe that anyone should be screaming at their mother.  No new laboratories.  Principal Problem: <principal problem not specified> Diagnosis: Active Problems:   Psychotic disorder (HCC)  Total Time spent  with patient: 20 minutes  Past Psychiatric History: See admission H&P.  Past Medical History: History reviewed. No pertinent past medical history. History reviewed. No pertinent surgical history. Family History: History reviewed. No pertinent family history. Family Psychiatric  History: See admission H&P. Social History:  Social History   Substance and Sexual Activity  Alcohol Use Yes  . Alcohol/week: 2.0 standard drinks  . Types: 2 Cans of beer per week   Comment: ocassionaly      Social History   Substance and Sexual Activity  Drug Use Not Currently    Social History   Socioeconomic History  . Marital status: Single    Spouse name: Not on file  . Number of children: Not on file  . Years of education: Not on file  . Highest education level: Not on file  Occupational History  . Not on file  Tobacco Use  . Smoking status: Current Every Day Smoker    Packs/day: 0.50  . Smokeless tobacco: Never Used  Substance and Sexual Activity  . Alcohol use: Yes    Alcohol/week: 2.0 standard drinks    Types: 2 Cans of beer per week    Comment: ocassionaly   . Drug use: Not Currently  . Sexual activity: Not Currently  Other Topics Concern  . Not on file  Social History Narrative  . Not on file   Social Determinants of Health   Financial Resource Strain:   .  Difficulty of Paying Living Expenses: Not on file  Food Insecurity:   . Worried About Programme researcher, broadcasting/film/video in the Last Year: Not on file  . Ran Out of Food in the Last Year: Not on file  Transportation Needs:   . Lack of Transportation (Medical): Not on file  . Lack of Transportation (Non-Medical): Not on file  Physical Activity:   . Days of Exercise per Week: Not on file  . Minutes of Exercise per Session: Not on file  Stress:   . Feeling of Stress : Not on file  Social Connections:   . Frequency of Communication with Friends and Family: Not on file  . Frequency of Social Gatherings with Friends and Family: Not on  file  . Attends Religious Services: Not on file  . Active Member of Clubs or Organizations: Not on file  . Attends Banker Meetings: Not on file  . Marital Status: Not on file   Additional Social History:                         Sleep: Good  Appetite:  Good  Current Medications: Current Facility-Administered Medications  Medication Dose Route Frequency Provider Last Rate Last Admin  . acetaminophen (TYLENOL) tablet 650 mg  650 mg Oral Q6H PRN Rankin, Shuvon B, NP      . alum & mag hydroxide-simeth (MAALOX/MYLANTA) 200-200-20 MG/5ML suspension 30 mL  30 mL Oral Q4H PRN Rankin, Shuvon B, NP      . carbamazepine (TEGRETOL) chewable tablet 200 mg  200 mg Oral TID Antonieta Pert, MD   200 mg at 11/12/19 0811  . haloperidol (HALDOL) tablet 10 mg  10 mg Oral Q8H PRN Antonieta Pert, MD   10 mg at 11/11/19 1445   Or  . haloperidol lactate (HALDOL) injection 10 mg  10 mg Intramuscular Q8H PRN Antonieta Pert, MD      . LORazepam (ATIVAN) tablet 2 mg  2 mg Oral Q6H PRN Antonieta Pert, MD   2 mg at 11/12/19 0811  . magnesium hydroxide (MILK OF MAGNESIA) suspension 30 mL  30 mL Oral Daily PRN Rankin, Shuvon B, NP      . ondansetron (ZOFRAN) tablet 4 mg  4 mg Oral Q8H PRN Rankin, Shuvon B, NP      . risperiDONE (RISPERDAL M-TABS) disintegrating tablet 3 mg  3 mg Oral Q8H PRN Antonieta Pert, MD       And  . ziprasidone (GEODON) injection 20 mg  20 mg Intramuscular Q6H PRN Antonieta Pert, MD      . risperiDONE (RISPERDAL M-TABS) disintegrating tablet 4 mg  4 mg Oral QHS Antonieta Pert, MD   4 mg at 11/11/19 2107  . [START ON 11/13/2019] risperiDONE (RISPERDAL) tablet 1 mg  1 mg Oral Daily Amato Sevillano, Marlane Mingle, MD        Lab Results: No results found for this or any previous visit (from the past 48 hour(s)).  Blood Alcohol level:  Lab Results  Component Value Date   ETH <10 11/08/2019    Metabolic Disorder Labs: No results found for: HGBA1C,  MPG No results found for: PROLACTIN No results found for: CHOL, TRIG, HDL, CHOLHDL, VLDL, LDLCALC  Physical Findings: AIMS: Facial and Oral Movements Muscles of Facial Expression: None, normal Lips and Perioral Area: None, normal Jaw: None, normal Tongue: None, normal,Extremity Movements Upper (arms, wrists, hands, fingers): None, normal Lower (legs, knees, ankles,  toes): None, normal, Trunk Movements Neck, shoulders, hips: None, normal, Overall Severity Severity of abnormal movements (highest score from questions above): None, normal Incapacitation due to abnormal movements: None, normal Patient's awareness of abnormal movements (rate only patient's report): No Awareness, Dental Status Current problems with teeth and/or dentures?: No Does patient usually wear dentures?: No  CIWA:    COWS:     Musculoskeletal: Strength & Muscle Tone: within normal limits Gait & Station: normal Patient leans: N/A  Psychiatric Specialty Exam: Physical Exam Vitals and nursing note reviewed.  HENT:     Head: Normocephalic and atraumatic.  Pulmonary:     Effort: Pulmonary effort is normal.  Neurological:     General: No focal deficit present.     Mental Status: He is alert and oriented to person, place, and time.     Review of Systems  Blood pressure 107/83, pulse 92, temperature 98 F (36.7 C), temperature source Oral, resp. rate 16, SpO2 99 %.There is no height or weight on file to calculate BMI.  General Appearance: Disheveled  Eye Contact:  Fair  Speech:  Normal Rate  Volume:  Decreased  Mood:  Dysphoric  Affect:  Congruent  Thought Process:  Coherent and Descriptions of Associations: Circumstantial  Orientation:  Full (Time, Place, and Person)  Thought Content:  Delusions and Paranoid Ideation  Suicidal Thoughts:  No  Homicidal Thoughts:  No  Memory:  Immediate;   Fair Recent;   Fair Remote;   Fair  Judgement:  Intact  Insight:  Lacking  Psychomotor Activity:  Decreased   Concentration:  Concentration: Fair and Attention Span: Fair  Recall:  [employer] of Knowledge:  Fair  Language:  Good  Akathisia:  Negative  Handed:  Right  AIMS (if indicated):     Assets:  Desire for Improvement Resilience  ADL's:  Intact  Cognition:  WNL  Sleep:  Number of Hours: 7.25     Treatment Plan Summary: Daily contact with patient to assess and evaluate symptoms and progress in treatment, Medication management and Plan : Patient is seen and examined.  Patient is a 34 year old male with the above-stated past psychiatric history who is seen in follow-up.   Diagnosis: 1.  Bipolar disorder type II 2.  Posttraumatic stress disorder 3.  Cannabis use disorder 4.  Unspecified psychosis  Pertinent findings on examination today: 1.  Patient had outburst yesterday of anger and threatening behavior. 2.  Patient this a.m. is more sedated. 3.  Patient denied auditory hallucinations. 4.  More stable mood. 5.  Decreased irritability. 6.  Improved sleep.  Plan: 1.  Continue Tegretol 200 mg p.o. 3 times daily for mood stability. 2.  Obtain Tegretol level, LFTs and CBC in a.m. 3.  Patient received haloperidol 10 mg p.o. 3 times daily yesterday.  I am going to change that to 10 mg p.o. every 8 hours as needed agitation and as well 10 mg IM as needed agitation if necessary. 4.  Continue lorazepam 2 mg p.o. every 6 hours as needed agitation. 5.  Continue Zofran 4 mg p.o. every 8 hours as needed nausea and vomiting. 6.  Continue Risperdal agitation protocol as needed. 7.  Change Risperdal to 4 mg p.o. nightly and 1 mg p.o. daily for psychotic symptoms and mood stability. 8.  Disposition planning-in progress.  Antonieta Pert, MD 11/12/2019, 12:56 PM

## 2019-11-12 NOTE — Progress Notes (Signed)
Pt anxious about D/C tomorrow, pt ready to leave. Pt educated on continuing medication after D/C and the Therapeutic level possibly being reached weeks after D/C . Pt informed to continue with outpatient services and to let provider know if anything changes good or bad.      11/12/19 2100  Psych Admission Type (Psych Patients Only)  Admission Status Involuntary  Psychosocial Assessment  Patient Complaints Anxiety  Eye Contact Brief  Facial Expression Sullen;Sad;Worried  Affect Anxious;Sad;Sullen  Speech Logical/coherent;Soft  Interaction Assertive  Motor Activity Slow  Appearance/Hygiene In scrubs  Behavior Characteristics Cooperative  Mood Anxious;Depressed  Thought Process  Coherency WDL  Content Preoccupation  Delusions None reported or observed  Perception Hallucinations  Hallucination Auditory  Judgment Poor  Confusion None  Danger to Self  Current suicidal ideation? Denies  Danger to Others  Danger to Others None reported or observed

## 2019-11-12 NOTE — Plan of Care (Signed)
Progress note  D: pt found in bed; compliant with medication administration. Pt expresses concerns with sadness, depression, and worrying. Pt feels this environment isn't conducive to their growth, and feels that the best area for this is at home with their children and wife. Pt expresses want to discharge. Pt was provided education on what needs to occur for this to take place, with reassurance provided. Pt states their voices have subsided. Pt continues to be isolative to their room and is pleasant Pt denies si/hi/ah/vh and verbally agrees to approach staff if these become apparent or before harming themself/others while at bhh.  A: Pt provided support and encouragement. Pt given medication per protocol and standing orders. Q28m safety checks implemented and continued.  R: Pt safe on the unit. Will continue to monitor.  Pt progressing in the following metrics  Problem: Activity: Goal: Will verbalize the importance of balancing activity with adequate rest periods Outcome: Progressing   Problem: Education: Goal: Will be free of psychotic symptoms Outcome: Progressing Goal: Knowledge of the prescribed therapeutic regimen will improve Outcome: Progressing   Problem: Coping: Goal: Coping ability will improve Outcome: Progressing

## 2019-11-12 NOTE — BHH Group Notes (Signed)
The focus of this group is to help patients establish daily goals to achieve during treatment and discuss how the patient can incorporate goal setting into their daily lives to aide in recovery.  Pt did not attend group 

## 2019-11-13 DIAGNOSIS — F17200 Nicotine dependence, unspecified, uncomplicated: Secondary | ICD-10-CM | POA: Diagnosis present

## 2019-11-13 DIAGNOSIS — F19959 Other psychoactive substance use, unspecified with psychoactive substance-induced psychotic disorder, unspecified: Secondary | ICD-10-CM

## 2019-11-13 DIAGNOSIS — R454 Irritability and anger: Secondary | ICD-10-CM

## 2019-11-13 DIAGNOSIS — F17211 Nicotine dependence, cigarettes, in remission: Secondary | ICD-10-CM

## 2019-11-13 DIAGNOSIS — F12922 Cannabis use, unspecified with intoxication with perceptual disturbance: Secondary | ICD-10-CM

## 2019-11-13 DIAGNOSIS — F12929 Cannabis use, unspecified with intoxication, unspecified: Secondary | ICD-10-CM | POA: Diagnosis present

## 2019-11-13 DIAGNOSIS — F4310 Post-traumatic stress disorder, unspecified: Secondary | ICD-10-CM

## 2019-11-13 LAB — CBC WITH DIFFERENTIAL/PLATELET
Abs Immature Granulocytes: 0.02 10*3/uL (ref 0.00–0.07)
Basophils Absolute: 0 10*3/uL (ref 0.0–0.1)
Basophils Relative: 1 %
Eosinophils Absolute: 0.1 10*3/uL (ref 0.0–0.5)
Eosinophils Relative: 1 %
HCT: 43.5 % (ref 39.0–52.0)
Hemoglobin: 15.4 g/dL (ref 13.0–17.0)
Immature Granulocytes: 0 %
Lymphocytes Relative: 50 %
Lymphs Abs: 2.7 10*3/uL (ref 0.7–4.0)
MCH: 31.8 pg (ref 26.0–34.0)
MCHC: 35.4 g/dL (ref 30.0–36.0)
MCV: 89.7 fL (ref 80.0–100.0)
Monocytes Absolute: 0.5 10*3/uL (ref 0.1–1.0)
Monocytes Relative: 9 %
Neutro Abs: 2.1 10*3/uL (ref 1.7–7.7)
Neutrophils Relative %: 39 %
Platelets: 219 10*3/uL (ref 150–400)
RBC: 4.85 MIL/uL (ref 4.22–5.81)
RDW: 12.7 % (ref 11.5–15.5)
WBC: 5.4 10*3/uL (ref 4.0–10.5)
nRBC: 0 % (ref 0.0–0.2)

## 2019-11-13 LAB — HEPATIC FUNCTION PANEL
ALT: 18 U/L (ref 0–44)
AST: 29 U/L (ref 15–41)
Albumin: 4 g/dL (ref 3.5–5.0)
Alkaline Phosphatase: 52 U/L (ref 38–126)
Bilirubin, Direct: 0.1 mg/dL (ref 0.0–0.2)
Indirect Bilirubin: 0.7 mg/dL (ref 0.3–0.9)
Total Bilirubin: 0.8 mg/dL (ref 0.3–1.2)
Total Protein: 7.4 g/dL (ref 6.5–8.1)

## 2019-11-13 LAB — CARBAMAZEPINE LEVEL, TOTAL: Carbamazepine Lvl: 4.4 ug/mL (ref 4.0–12.0)

## 2019-11-13 MED ORDER — RISPERIDONE 4 MG PO TBDP: 4.0000 mg | ORAL_TABLET | Freq: Every day | ORAL | 0 refills | Status: AC

## 2019-11-13 MED ORDER — RISPERIDONE 2 MG PO TABS: 2.0000 mg | ORAL_TABLET | Freq: Every day | ORAL | 0 refills | Status: AC

## 2019-11-13 MED ORDER — CARBAMAZEPINE 100 MG PO CHEW: 300.0000 mg | CHEWABLE_TABLET | Freq: Two times a day (BID) | ORAL | 0 refills | Status: AC

## 2019-11-13 NOTE — B[facility] Suicide Risk Assessment (Signed)
[facility] [place] Discharge Suicide Risk Assessment   Principal Problem: Substance-induced psychotic disorder [facility]) Discharge Diagnoses: Principal Problem:   Substance-induced psychotic disorder (HCC) Active Problems:   PTSD (post-traumatic stress disorder)   Difficulty controlling anger   Marijuana intoxication (HCC)   Nicotine dependence   Total Time spent with patient: 45 minutes  Musculoskeletal: Strength & Muscle Tone: within normal limits Gait & Station: normal Patient leans: N/A  Psychiatric Specialty Exam: Review of Systems  Constitutional: Negative.   HENT: Negative.   Respiratory: Negative.   Cardiovascular: Negative.   Gastrointestinal: Negative.   Musculoskeletal: Negative.   Neurological: Negative.   Psychiatric/Behavioral: Negative for agitation, behavioral problems, confusion, decreased concentration, dysphoric mood, hallucinations, self-injury, sleep disturbance and suicidal ideas. The patient is not nervous/anxious and is not hyperactive.     Blood pressure 107/83, pulse 92, temperature 98 F (36.7 C), temperature source Oral, resp. rate 16, SpO2 99 %.There is no height or weight on file to calculate BMI.  General Appearance: Casual and Neat  Eye Contact::  Good  Speech:  Clear and Coherent and Normal Rate409  Volume:  Normal  Mood:  Euthymic  Affect:  Congruent  Thought Process:  Coherent, Linear and Descriptions of Associations: Intact  Orientation:  Full (Time, Place, and Person)  Thought Content:  Logical and Hallucinations: None  Suicidal Thoughts:  No  Homicidal Thoughts:  No  Memory:  Immediate;   Good Recent;   Good Remote;   Good  Judgement:  Fair  Insight:  Fair  Psychomotor Activity:  Normal  Concentration:  Good  Recall:  Good  Fund of Knowledge:Good  Language: Good  Akathisia:  No  Handed:  Right  AIMS (if indicated):   0  Assets:  Communication Skills Desire for Improvement Financial Resources/Insurance Housing Intimacy Leisure  Time Physical Health Resilience Social Support  Sleep:  Number of Hours: 6.75  Cognition: WNL  ADL's:  Intact   Mental Status Per Nursing Assessment::   On Admission:  Self-harm thoughts, Self-harm behaviors   On evaluation on day of discharge, patient relates that he got out of prison in December 2020 and had difficulty holding down a job because of his anger.  He did make a suicide attempt after getting out of prison where he wrote his suicide note, wrapped it and pulled a gun on himself.  His brother was aware that he was doing this and was able to stop him.  Patient states that he began having auditory hallucinations that were arguments in his head that were very noisy which are making him suicidal.  He states that he has not had any voices since admission and being started on antipsychotics.  He states in the last 2 days he has not even "been looking for voices".  Discussed with patient use of marijuana causing psychotic symptoms.  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   He intends to complete his outpatient follow-[facility] through [facility].  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 Patient describes being agitated by a peer on 11/12/2019.  He states he was able to her notice his trigger, felt shaking and recognize he needed to remove himself from the situation.  He states he went to his room, and spoke with a mental health tech.  He did take medication at this time to calm down.  He does not believe he needs additional as needed medication after discharge.  He is made aware that he can go to the [facility] at any time if he is feeling agitated and needs support.  A family meeting was held with patient's significant other, Shanice [phone]).  Patient lives with her niece and  her 2 children (4-year-old daughter and 88-year-old son).  She niece states that she has been in contact with patient while hospitalized and has noticed improvement.  Patient is able to discuss his medications, times for administration, uses with his significant other, and asks her to ensure that he takes them by putting them in a pillbox.  For which she agrees.  Shanice reassures that patient does not have access to guns or other weapons to harm himself or others.  She does not believe that he is a danger to himself or others, and states she is willing to bring him back to the hospital should she have concerns of patient's safety.  Demographic Factors:  Male and Unemployed  Loss Factors: Decrease in vocational status  Historical Factors: Prior suicide attempts, Impulsivity and Patient witnessed a murder in 2010, and was charged with second-degree murder for which she spent 10 years in prison.  Risk Reduction Factors:   Responsible for children under 58 years of age, Sense of responsibility to family, Religious beliefs about death, Living with another person, especially a relative, Positive social support, Positive therapeutic relationship and Positive coping skills or problem solving skills  Continued Clinical Symptoms:  Alcohol/Substance Abuse/Dependencies Anger management difficulties Psychosis  Cognitive Features That Contribute To Risk:  None    Suicide Risk:  Minimal: No identifiable suicidal ideation.  Patients presenting with no risk factors but with morbid ruminations; may be classified as minimal risk based on the severity of the depressive symptoms   Follow-up Information    [facility]. Go on 11/16/2019.   Specialty: Behavioral Health Why: You have an appointment on 11/16/19 at 9:00 am for therapy.  You also have an appointment for medication management on 12/09/19 at 2:30 pm. These appointments will be held in person. Contact information: [phone] [place] [address] [phone]              Plan Of Care/Follow-up recommendations:  Activity:  Ad lib. Diet:  As tolerated Tests:  Hemoglobin A1c and lipids added to today's lab work for baseline levels of metabolic monitoring while on antipsychotics. Other:  Ensure follow-up with outpatient psychiatrist and therapist as scheduled above   On day of discharge following sustained improvement in the affect of this patient, continued report of euthymic mood, repeated denial of suicidal, homicidal, and other violent ideation, adequate interaction with peers, active participation in groups while on the unit, and denial of adverse reactions from medications, the treatment team decided James Green was stable for discharge home with scheduled mental health treatment as noted.  He was able to engage in safety planning including plan to return to [facility] or contact emergency services if he feels unable to maintain his own safety or the safety of others. Patient had no further questions, comments, or concerns. Discharge into care of significant other, who agrees to maintain patient safety.  Patient aware to return to nearest crisis center, ED or to call 911 for worsening symptoms of depression, suicidal or homicidal thoughts or AVH.   Mariel Craft, MD 11/13/2019, 11:18 AM

## 2019-11-13 NOTE — B[facility] Group Notes (Signed)
The focus of this group is to help patients establish daily goals to achieve during treatment and discuss how the patient can incorporate goal setting into their daily lives to aide in recovery.  Pt stated that his goal was to"'Stay out on the way of things that I know will trigger me." Pt said that things that trigger him are, Arguments either him arguing with someone or anyone else arguing. He also said that a trigger for him is too many people in a small space.

## 2019-11-13 NOTE — Progress Notes (Signed)
Pt has been doing well with the medication controlling the voices. Pt stated that when people making a lot of unnecessary noises and things it gets bad for him because it is amplifying the voices. That's why pt does not like to go down for meals due to too much stimulation.
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
## 2019-11-13 NOTE — Progress Notes (Signed)
  [facility] Adult Case Management Discharge Plan :  Will you be returning to the same living situation after discharge:  Yes,  to home with significant other At discharge, do you have transportation home?: Yes,  s/o to pick this patient up Do you have the ability to pay for your medications: No. Samples to be provided at discharge   Release of information consent forms completed and in the chart;  Patient's signature needed at discharge.  Patient to Follow up at:  Follow-up Information    [facility]. Go on 11/16/2019.   Specialty: Behavioral Health Why: You have an appointment on 11/16/19 at 9:00 am for therapy.  You also have an appointment for medication management on 12/09/19 at 2:30 pm. These appointments will be held in person. Contact information: [address] [place] [address] [phone]              Next level of care provider has access to [facility] Link:yes  Safety Planning and Suicide Prevention discussed: Yes,  with girlfriend     Has patient been referred to the Quitline?: Patient refused referral  Patient has been referred for addiction treatment: Pt. refused referral  Otelia Santee, LCSW 11/13/2019, 1:26 PM

## 2019-11-13 NOTE — Progress Notes (Signed)
Pt discharged to lobby. Pt was stable and appreciative at that time. All papers, samples and prescriptions were given and valuables returned. Verbal understanding expressed. Denies SI/HI and A/VH. Pt given opportunity to express concerns and ask questions.  

## 2019-11-16 ENCOUNTER — Ambulatory Visit (INDEPENDENT_AMBULATORY_CARE_PROVIDER_SITE_OTHER): Payer: No Payment, Other | Admitting: Licensed Clinical Social Worker

## 2019-11-16 ENCOUNTER — Other Ambulatory Visit: Payer: Self-pay

## 2019-11-16 DIAGNOSIS — F411 Generalized anxiety disorder: Secondary | ICD-10-CM | POA: Diagnosis not present
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
## 2019-12-09 ENCOUNTER — Encounter (HOSPITAL_COMMUNITY): Payer: No Payment, Other | Admitting: Psychiatry

## 2020-01-04 ENCOUNTER — Ambulatory Visit (HOSPITAL_COMMUNITY): Payer: No Payment, Other | Admitting: Licensed Clinical Social Worker

## 2020-01-18 ENCOUNTER — Ambulatory Visit (HOSPITAL_COMMUNITY): Payer: Self-pay | Admitting: Licensed Clinical Social Worker

## 2020-06-07 ENCOUNTER — Other Ambulatory Visit: Payer: Self-pay

## 2020-06-07 ENCOUNTER — Encounter (HOSPITAL_COMMUNITY): Payer: Self-pay

## 2020-06-07 ENCOUNTER — Emergency Department (HOSPITAL_COMMUNITY)
Admission: EM | Admit: 2020-06-07 | Discharge: 2020-06-07 | Disposition: A | Discharge status: Home / Self Care | Payer: Self-pay | Attending: Emergency Medicine | Admitting: Emergency Medicine

## 2020-06-07 DIAGNOSIS — R109 Unspecified abdominal pain: Secondary | ICD-10-CM

## 2020-06-07 DIAGNOSIS — R110 Nausea: Secondary | ICD-10-CM

## 2020-06-07 DIAGNOSIS — Z20822 Contact with and (suspected) exposure to covid-19: Secondary | ICD-10-CM | POA: Insufficient documentation

## 2020-06-07 DIAGNOSIS — F17200 Nicotine dependence, unspecified, uncomplicated: Secondary | ICD-10-CM | POA: Insufficient documentation

## 2020-06-07 DIAGNOSIS — R197 Diarrhea, unspecified: Secondary | ICD-10-CM | POA: Insufficient documentation

## 2020-06-07 LAB — CBC WITH DIFFERENTIAL/PLATELET
Abs Immature Granulocytes: 0.02 10*3/uL (ref 0.00–0.07)
Basophils Absolute: 0 10*3/uL (ref 0.0–0.1)
Basophils Relative: 0 %
Eosinophils Absolute: 0 10*3/uL (ref 0.0–0.5)
Eosinophils Relative: 1 %
HCT: 45.8 % (ref 39.0–52.0)
Hemoglobin: 16.1 g/dL (ref 13.0–17.0)
Immature Granulocytes: 0 %
Lymphocytes Relative: 19 %
Lymphs Abs: 1 10*3/uL (ref 0.7–4.0)
MCH: 31.8 pg (ref 26.0–34.0)
MCHC: 35.2 g/dL (ref 30.0–36.0)
MCV: 90.5 fL (ref 80.0–100.0)
Monocytes Absolute: 0.8 10*3/uL (ref 0.1–1.0)
Monocytes Relative: 15 %
Neutro Abs: 3.3 10*3/uL (ref 1.7–7.7)
Neutrophils Relative %: 65 %
Platelets: 213 10*3/uL (ref 150–400)
RBC: 5.06 MIL/uL (ref 4.22–5.81)
RDW: 12.7 % (ref 11.5–15.5)
WBC: 5.2 10*3/uL (ref 4.0–10.5)
nRBC: 0 % (ref 0.0–0.2)

## 2020-06-07 LAB — COMPREHENSIVE METABOLIC PANEL
ALT: 21 U/L (ref 0–44)
AST: 21 U/L (ref 15–41)
Albumin: 3.6 g/dL (ref 3.5–5.0)
Alkaline Phosphatase: 53 U/L (ref 38–126)
Anion gap: 6 (ref 5–15)
BUN: 8 mg/dL (ref 6–20)
CO2: 24 mmol/L (ref 22–32)
Calcium: 8.8 mg/dL — ABNORMAL LOW (ref 8.9–10.3)
Chloride: 104 mmol/L (ref 98–111)
Creatinine, Ser: 1.21 mg/dL (ref 0.61–1.24)
GFR, Estimated: >60 mL/min (ref >60)
Glucose, Bld: 102 mg/dL — ABNORMAL HIGH (ref 70–99)
Potassium: 3.8 mmol/L (ref 3.5–5.1)
Sodium: 134 mmol/L — ABNORMAL LOW (ref 135–145)
Total Bilirubin: 0.6 mg/dL (ref 0.3–1.2)
Total Protein: 6.4 g/dL — ABNORMAL LOW (ref 6.5–8.1)

## 2020-06-07 LAB — RESP PANEL BY RT-PCR (FLU A&B, COVID) ARPGX2
Influenza A by PCR: NEGATIVE
Influenza B by PCR: NEGATIVE
SARS Coronavirus 2 by RT PCR: NEGATIVE

## 2020-06-07 LAB — LIPASE, BLOOD: Lipase: 25 U/L (ref 11–51)

## 2020-06-07 MED ORDER — DICYCLOMINE HCL 20 MG PO TABS: 20.0000 mg | ORAL_TABLET | Freq: Two times a day (BID) | ORAL | 0 refills | Status: AC

## 2020-06-07 MED ORDER — DICYCLOMINE HCL 10 MG PO CAPS
10.0000 mg | ORAL_CAPSULE | Freq: Once | ORAL | Status: AC
  Administered 2020-06-07: 10 mg via ORAL
  Filled 2020-06-07: qty 1

## 2020-06-07 MED ORDER — ONDANSETRON HCL 4 MG/2ML IJ SOLN
4.0000 mg | Freq: Once | INTRAMUSCULAR | Status: AC
  Administered 2020-06-07: 4 mg via INTRAVENOUS
  Filled 2020-06-07: qty 2

## 2020-06-07 MED ORDER — SODIUM CHLORIDE 0.9 % IV BOLUS
1000.0000 mL | Freq: Once | INTRAVENOUS | Status: AC
  Administered 2020-06-07: 1000 mL via INTRAVENOUS

## 2020-06-07 MED ORDER — ONDANSETRON HCL 4 MG PO TABS: 4.0000 mg | ORAL_TABLET | Freq: Three times a day (TID) | ORAL | 0 refills | Status: AC | PRN

## 2020-06-07 NOTE — ED Notes (Signed)
Patient verbalizes understanding of discharge instructions. Opportunity for questioning and answers were provided. Pt discharged from ED. 

## 2020-06-07 NOTE — ED Triage Notes (Signed)
Pt c/o N/V/D x 2 days; states it began after eating at a seafood restaurant; took ibuprofen at home x 2 hours ago; endorses intermittent abdominal cramping; denies known sick contacts; has not had covid vaccine

## 2020-06-07 NOTE — ED Provider Notes (Signed)
[facility] EMERGENCY DEPARTMENT Provider Note   CSN: [id number] Arrival date & time: 06/07/20  1003     History No chief complaint on file.   James Green is a 35 y.o. male who presents with 2 days of n/v diarrhea. Patient went out on mother's day and ate seafood. He awoke in the middle of the night with intense abdominal cramping and has had multiple episodes of Watery brown diarrhea  Over the past 2 days. He has had nausea and 1 episode of self induced vomiting. He denies fever or chills. No other parties were sickened at [employer]. He did not take anything for sxs pta and he is feeling improved from the past 2 days.   HPI     History reviewed. No pertinent past medical history.  Patient Active Problem List   Diagnosis Date Noted  . Difficulty controlling anger 11/13/2019  . Marijuana intoxication (HCC) 11/13/2019  . Nicotine dependence 11/13/2019  . 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 Severe episode of recurrent major depressive disorder, with psychotic features (HCC)   . PTSD (post-traumatic stress disorder)     History reviewed. No pertinent surgical history.     No family history on file.  Social History   Tobacco Use  . Smoking status: Current Every Day Smoker    Packs/day: 0.50  . Smokeless tobacco: Never Used  Substance Use Topics  . Alcohol use: Yes    Alcohol/week: 2.0 standard drinks    Types: 2 Cans of beer per week    Comment: ocassionaly   . Drug use: Yes    Types: Marijuana    Home Medications Prior to Admission medications   Medication Sig Start Date End Date Taking? Authorizing Provider  carbamazepine (TEGRETOL) 100 MG chewable tablet Chew 3 tablets (300 mg total) by mouth 2 (two) times daily. 11/13/19   Mariel Craft, MD  risperiDONE (RISPERDAL M-TABS) 4 MG disintegrating tablet Take 1 tablet (4 mg total) by mouth at bedtime. 11/13/19   Mariel Craft, MD  risperiDONE (RISPERDAL) 2 MG  tablet Take 1 tablet (2 mg total) by mouth daily. 11/14/19   Mariel Craft, MD    Allergies    Patient has no known allergies.  Review of Systems   Review of Systems Ten systems reviewed and are negative for acute change, except as noted in the HPI.   Physical Exam Updated Vital Signs BP 115/76   Pulse (!) 50   Temp 98.4 F (36.9 C) (Oral)   Resp 19   Ht 6\' 1"  (1.854 m)   Wt 122.5 kg   SpO2 96%   BMI 35.62 kg/m   Physical Exam Vitals and nursing note reviewed.  Constitutional:      General: He is not in acute distress.    Appearance: He is well-developed. He is not diaphoretic.  HENT:     Head: Normocephalic and atraumatic.  Eyes:     General: No scleral icterus.    Conjunctiva/sclera: Conjunctivae normal.  Cardiovascular:     Rate and Rhythm: Normal rate and regular rhythm.     Heart sounds: Normal heart sounds.  Pulmonary:     Effort: Pulmonary effort is normal. No respiratory distress.     Breath sounds: Normal breath sounds.  Abdominal:     General: Bowel sounds are increased. There is no distension.     Palpations: Abdomen is soft.     Tenderness: There is no abdominal tenderness.  Musculoskeletal:  Cervical back: Normal range of motion and neck supple.  Skin:    General: Skin is warm and dry.  Neurological:     Mental Status: He is alert.  Psychiatric:        Behavior: Behavior normal.     ED Results / Procedures / Treatments   Labs (all labs ordered are listed, but only abnormal results are displayed) Labs Reviewed  COMPREHENSIVE METABOLIC PANEL - Abnormal; Notable for the following components:      Result Value   Sodium 134 (*)    Glucose, Bld 102 (*)    Calcium 8.8 (*)    Total Protein 6.4 (*)    All other components within normal limits  RESP PANEL BY RT-PCR (FLU A&B, COVID) ARPGX2  CBC WITH DIFFERENTIAL/PLATELET  LIPASE, BLOOD    EKG None  Radiology No results found.  Procedures Procedures   Medications Ordered in  ED Medications  ondansetron (ZOFRAN) injection 4 mg (4 mg Intravenous Given 06/07/20 1045)  sodium chloride 0.9 % bolus 1,000 mL (1,000 mLs Intravenous New Bag/Given 06/07/20 1045)  dicyclomine (BENTYL) capsule 10 mg (10 mg Oral Given 06/07/20 1047)    ED Course  I have reviewed the triage vital signs and the nursing notes.  Pertinent labs & imaging results that were available during my care of the patient were reviewed by me and considered in my medical decision making (see chart for details).    MDM Rules/Calculators/A&P                          Patient here with diarrhea The differential diagnosis of diarrhea includes but is not limited to Viral- norovirus/rotavirus; Bacterial-Campylobacter,Shigella, Salmonella, Escherichia coli, E. coli 0157:H7, Yersinia enterocolitica, Vibrio cholerae, Clostridium difficile. Parasitic- Giardia lamblia, Cryptosporidium,Entamoeba histolytica,Cyclospora, Microsporidium. Toxin- Staphylococcus aureus, Bacillus cereus. Noninfectious causes include GI Bleed, Appendicitis, Mesenteric Ischemia, Diverticulitis, Adrenal Crisis, Thyroid Storm, Toxicologic exposures, Antibiotic or drug-associated, inflammatory bowel disease. I ordered and reviewed labs that included CBC which has no abnormality, lipase within normal limits, respiratory panel is negative for COVID and influenza.  CMP shows mildly low sodium slightly elevated glucose both of insignificant value.  Patient given fluids, Bentyl, Zofran with significant improvement in his symptoms.  No nausea vomiting or diarrhea here in the emergency department.  He is afebrile and hemodynamically stable.  Given the patient's symptoms are improving significantly over the last 48 hours feel that he is safe to go home with symptomatic treatment.  Discussed outpatient follow-up and return precautions.  Final Clinical Impression(s) / ED Diagnoses Final diagnoses:  None    Rx / DC Orders ED Discharge Orders    None        Arthor Captain, PA-C 06/07/20 1446    Cathren Laine, MD 06/08/20 1514

## 2020-06-07 NOTE — Discharge Instructions (Signed)
Your test is negative for COVID-19 and influenza. I have discharging you with symptomatic treatment for nausea and abdominal cramping.  The remainder of your labs look excellent.  Get help right away if: You have chest pain. You feel extremely weak or you faint. You have bloody or black stools or stools that look like tar. You have severe pain, cramping, or bloating in your abdomen. You have trouble breathing or you are breathing very quickly. Your heart is beating very quickly. Your skin feels cold and clammy. You feel confused. You have signs of dehydration, such as: Dark urine, very little urine, or no urine. Cracked lips. Dry mouth. Sunken eyes. Sleepiness. Weakness.

## 2021-01-08 ENCOUNTER — Encounter (HOSPITAL_BASED_OUTPATIENT_CLINIC_OR_DEPARTMENT_OTHER): Payer: Self-pay | Admitting: Emergency Medicine

## 2021-01-08 ENCOUNTER — Emergency Department (HOSPITAL_BASED_OUTPATIENT_CLINIC_OR_DEPARTMENT_OTHER): Payer: Self-pay

## 2021-01-08 ENCOUNTER — Other Ambulatory Visit: Payer: Self-pay

## 2021-01-08 DIAGNOSIS — S8991XA Unspecified injury of right lower leg, initial encounter: Secondary | ICD-10-CM | POA: Insufficient documentation

## 2021-01-08 DIAGNOSIS — W01198A Fall on same level from slipping, tripping and stumbling with subsequent striking against other object, initial encounter: Secondary | ICD-10-CM | POA: Insufficient documentation

## 2021-01-08 DIAGNOSIS — F17210 Nicotine dependence, cigarettes, uncomplicated: Secondary | ICD-10-CM | POA: Insufficient documentation

## 2021-01-08 DIAGNOSIS — W19XXXA Unspecified fall, initial encounter: Secondary | ICD-10-CM | POA: Insufficient documentation

## 2021-01-08 NOTE — ED Triage Notes (Addendum)
Reports he fell about two stories off the roof around 2pm.  Was ambulatory afterwards.  Now c/o pain in the right knee.

## 2021-01-09 ENCOUNTER — Emergency Department (HOSPITAL_BASED_OUTPATIENT_CLINIC_OR_DEPARTMENT_OTHER)
Admission: EM | Admit: 2021-01-09 | Discharge: 2021-01-09 | Disposition: A | Discharge status: Home / Self Care | Payer: Self-pay | Attending: Emergency Medicine | Admitting: Emergency Medicine

## 2021-01-09 DIAGNOSIS — S8991XA Unspecified injury of right lower leg, initial encounter: Secondary | ICD-10-CM

## 2021-01-09 DIAGNOSIS — W19XXXA Unspecified fall, initial encounter: Secondary | ICD-10-CM

## 2021-01-09 MED ORDER — ACETAMINOPHEN 500 MG PO TABS
1000.0000 mg | ORAL_TABLET | Freq: Once | ORAL | Status: AC
  Administered 2021-01-09: 1000 mg via ORAL
  Filled 2021-01-09: qty 2

## 2021-01-09 NOTE — ED Notes (Signed)
Patient discharged to home.  All discharge instructions reviewed.  Patient verbalized understanding via teachback method.  VS WDL.  Respirations even and unlabored.  Ambulatory out of ED.   °

## 2021-01-09 NOTE — ED Provider Notes (Signed)
[facility] EMERGENCY DEPARTMENT Provider Note  CSN: [id number] Arrival date & time: 01/08/21 2310  Chief Complaint(s) Fall  HPI James Green is a 35 y.o. male here with right knee pain.  Patient reports that he was recording a video.  Was apparently climbing down from a roof of a convenience store onto trash cans when he slipped causing him to fall onto the trash cans where he reports his knee buckled.  He reports doing "a ninja roll" after hitting the ground.  Patient was able to get up and ambulate without any difficulty.  He denied any pain during that time.  Incident occurred around 2 PM.  Reports that he went about his day and after getting home around 10 PM began feeling pain that gradually worsened.  Pain described as a aching throbbing pain.  Worse with ambulation and range of motion.  Alleviated by mobility.  Denies any other injuries from the incident.    Fall   Past Medical History History reviewed. No pertinent past medical history. 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 Authorizing Provider  carbamazepine (TEGRETOL) 100 MG chewable tablet Chew 3 tablets (300 mg total) by mouth 2 (two) times daily. 11/13/19   Mariel Craft, MD  dicyclomine (BENTYL) 20 MG tablet Take 1 tablet (20 mg total) by mouth 2 (two) times daily. 06/07/20   Harris, Abigail, PA-C  ondansetron (ZOFRAN) 4 MG tablet Take 1 tablet (4 mg total) by mouth every 8 (eight) hours as needed for nausea or vomiting. 06/07/20   Arthor Captain, PA-C  risperiDONE (RISPERDAL M-TABS) 4 MG  disintegrating tablet Take 1 tablet (4 mg total) by mouth at bedtime. 11/13/19   Mariel Craft, MD  risperiDONE (RISPERDAL) 2 MG tablet Take 1 tablet (2 mg total) by mouth daily. 11/14/19   Mariel Craft, MD                                                                                                                                    Past Surgical History Past Surgical History:  Procedure Laterality Date   CHEST TUBE INSERTION     Family History No family history on file.  Social History Social History   Tobacco Use   Smoking status: Every Day    Packs/day: 0.50    Types: Cigarettes   Smokeless tobacco: Never  Substance Use Topics   Alcohol use: Yes    Alcohol/week: 2.0 standard drinks    Types: 2 Cans of beer per week    Comment: ocassionaly  Drug use: Yes    Types: Marijuana   Allergies Patient has no known allergies.  Review of Systems Review of Systems All other systems are reviewed and are negative for acute change except as noted in the HPI  Physical Exam Vital Signs  I have reviewed the triage vital signs BP 130/72 (BP Location: Right Arm)   Pulse 65   Temp 98.5 F (36.9 C) (Oral)   Resp 16   Ht  (1.88 m)   Wt 122.5 kg   SpO2 98%   BMI 34.67 kg/m   Physical Exam Vitals reviewed.  Constitutional:      General: He is not in acute distress.    Appearance: He is well-developed. He is not diaphoretic.  HENT:     Head: Normocephalic and atraumatic.     Right Ear: External ear normal.     Left Ear: External ear normal.     Nose: Nose normal.     Mouth/Throat:     Mouth: Mucous membranes are moist.  Eyes:     General: No scleral icterus.    Conjunctiva/sclera: Conjunctivae normal.  Neck:     Trachea: Phonation normal.  Cardiovascular:     Rate and Rhythm: Normal rate and regular rhythm.  Pulmonary:     Effort: Pulmonary effort is normal. No respiratory distress.     Breath sounds: No stridor.  Abdominal:     General: There is no  distension.  Musculoskeletal:        General: Normal range of motion.     Cervical back: Normal range of motion.     Right knee: Swelling present. No deformity, effusion, lacerations or bony tenderness. Normal range of motion. Tenderness present over the medial joint line. No patellar tendon tenderness. No LCL laxity, MCL laxity, ACL laxity or PCL laxity. Normal alignment. Normal pulse.     Instability Tests: Anterior drawer test negative. Posterior drawer test negative.  Neurological:     Mental Status: He is alert and oriented to person, place, and time.  Psychiatric:        Behavior: Behavior normal.    ED Results and Treatments Labs (all labs ordered are listed, but only abnormal results are displayed) Labs Reviewed - No data to display                                                                                                                       EKG  EKG Interpretation  Date/Time:    Ventricular Rate:    PR Interval:    QRS Duration:   QT Interval:    QTC Calculation:   R Axis:     Text Interpretation:         Radiology DG Knee Complete 4 Views Right  Result Date: 01/08/2021 CLINICAL DATA:  Trauma to the right knee. EXAM: RIGHT KNEE - COMPLETE 4+ VIEW COMPARISON:  None. FINDINGS: There is no acute fracture or dislocation. The bones are well mineralized. No arthritic changes. Chronic bone  fragment at the insertion of the patellar tendon over the tibial tuberosity. No significant joint effusion. The soft tissues are unremarkable. IMPRESSION: No acute fracture or dislocation. Electronically Signed   By: Elgie Collard M.D.   On: 01/08/2021 23:50    Pertinent labs & imaging results that were available during my care of the patient were reviewed by me and considered in my medical decision making (see MDM for details).  Medications Ordered in ED Medications  acetaminophen (TYLENOL) tablet 1,000 mg (1,000 mg Oral Given 01/09/21 1610)                                                                                                                                      Procedures Procedures  (including critical care time)  Medical Decision Making / ED Course I have reviewed the nursing notes for this encounter and the patient's prior records (if available in EHR or on provided paperwork).  DEVEION DENZ was evaluated in Emergency Department on 01/09/2021 for the symptoms described in the history of present illness. He was evaluated in the context of the global COVID-19 pandemic, which necessitated consideration that the patient might be at risk for infection with the SARS-CoV-2 virus that causes COVID-19. Institutional protocols and algorithms that pertain to the evaluation of patients at risk for COVID-19 are in a state of rapid change based on information released by regulatory bodies including the CDC and federal and state organizations. These policies and algorithms were followed during the patient's care in the ED.     Right knee pain After mechanical fall Plain film negative for fracture or dislocation. Low suspicion for occult fracture Possible soft tissue contusion vs sprain. Brace provided. Tylenol given. Supportive management. PCP/Sport med follow up if not resolved in a few weeks.  Pertinent labs & imaging results that were available during my care of the patient were reviewed by me and considered in my medical decision making:    Final Clinical Impression(s) / ED Diagnoses Final diagnoses:  Fall, initial encounter  Injury of right knee, initial encounter   The patient appears reasonably screened and/or stabilized for discharge and I doubt any other medical condition or other [facility] requiring further screening, evaluation, or treatment in the ED at this time prior to discharge. Safe for discharge with strict return precautions.  Disposition: Discharge  Condition: Good  I have discussed the results, Dx and Tx plan with the patient/family  who expressed understanding and agree(s) with the plan. Discharge instructions discussed at length. The patient/family was given strict return precautions who verbalized understanding of the instructions. No further questions at time of discharge.    ED Discharge Orders     None        Follow Up: Patient, No Pcp Per  Call  if you do not have a primary care physician, contact [facility] at [phone] for referral  Myra Rude, MD [address] [place] [address] [phone]  Call in 2 weeks if symptoms do not improve or  worsen     This chart was dictated using voice recognition software.  Despite best efforts to proofread,  errors can occur which can change the documentation meaning.    Nira Conn, MD 01/09/21 [phone]

## 2021-06-23 ENCOUNTER — Other Ambulatory Visit: Payer: Self-pay

## 2021-06-23 ENCOUNTER — Encounter (HOSPITAL_COMMUNITY): Payer: Self-pay

## 2021-06-23 ENCOUNTER — Emergency Department (HOSPITAL_COMMUNITY)
Admission: EM | Admit: 2021-06-23 | Discharge: 2021-06-23 | Disposition: A | Discharge status: Home / Self Care | Payer: Self-pay | Attending: Emergency Medicine | Admitting: Emergency Medicine

## 2021-06-23 ENCOUNTER — Emergency Department (HOSPITAL_COMMUNITY): Payer: Self-pay

## 2021-06-23 DIAGNOSIS — S39012A Strain of muscle, fascia and tendon of lower back, initial encounter: Secondary | ICD-10-CM | POA: Insufficient documentation

## 2021-06-23 DIAGNOSIS — X509XXA Other and unspecified overexertion or strenuous movements or postures, initial encounter: Secondary | ICD-10-CM | POA: Insufficient documentation

## 2021-06-23 MED ORDER — KETOROLAC TROMETHAMINE 15 MG/ML IJ SOLN
30.0000 mg | Freq: Once | INTRAMUSCULAR | Status: AC
  Administered 2021-06-23: 30 mg via INTRAMUSCULAR
  Filled 2021-06-23: qty 2

## 2021-06-23 MED ORDER — PREDNISONE 20 MG PO TABS
60.0000 mg | ORAL_TABLET | Freq: Once | ORAL | Status: AC
  Administered 2021-06-23: 60 mg via ORAL
  Filled 2021-06-23: qty 3

## 2021-06-23 MED ORDER — PREDNISONE 20 MG PO TABS: 40.0000 mg | ORAL_TABLET | Freq: Every day | ORAL | 0 refills | Status: DC

## 2021-06-23 MED ORDER — IBUPROFEN 200 MG PO TABS
600.0000 mg | ORAL_TABLET | Freq: Once | ORAL | Status: AC
  Administered 2021-06-23: 600 mg via ORAL
  Filled 2021-06-23: qty 3

## 2021-06-23 MED ORDER — METHOCARBAMOL 500 MG PO TABS: 500.0000 mg | ORAL_TABLET | Freq: Three times a day (TID) | ORAL | 0 refills | Status: AC | PRN

## 2021-06-23 MED ORDER — LIDOCAINE 5 % EX PTCH
1.0000 | MEDICATED_PATCH | CUTANEOUS | Status: DC
  Administered 2021-06-23: 1 via TRANSDERMAL
  Filled 2021-06-23: qty 1

## 2021-06-23 MED ORDER — DIAZEPAM 2 MG PO TABS
2.0000 mg | ORAL_TABLET | Freq: Once | ORAL | Status: AC
Start: 2021-06-23 — End: 2021-06-23
  Administered 2021-06-23: 2 mg via ORAL
  Filled 2021-06-23: qty 1

## 2021-06-23 MED ORDER — METHOCARBAMOL 500 MG PO TABS
500.0000 mg | ORAL_TABLET | Freq: Once | ORAL | Status: AC
  Administered 2021-06-23: 500 mg via ORAL
  Filled 2021-06-23: qty 1

## 2021-06-23 NOTE — ED Provider Notes (Signed)
[facility]EMERGENCY DEPT Provider Note   CSN: [id number] Arrival date & time: 06/23/21  1819     History  Chief Complaint  Patient presents with   Back Pain    James Green is a 36 y.o. male.   Back Pain Associated symptoms: no numbness and no weakness   Patient presents with low back pain.  States he was sitting awkwardly stood up and felt a crack in his back.  Pain.  Worse with certain movements.  Felt a pop later.  Hurts when he stands on his right left leg but pain with lifting the right leg.  No loss of bladder or bowel control.  Has had some mild back issues before but usually not much of an issue.    Home Medications Prior to Admission medications   Medication Sig Start Date End Date Taking? Authorizing Provider  methocarbamol (ROBAXIN) 500 MG tablet Take 1 tablet (500 mg total) by mouth every 8 (eight) hours as needed for muscle spasms. 06/23/21  Yes Benjiman Core, MD  predniSONE (DELTASONE) 20 MG tablet Take 2 tablets (40 mg total) by mouth daily. 06/23/21  Yes Benjiman Core, MD  carbamazepine (TEGRETOL) 100 MG chewable tablet Chew 3 tablets (300 mg total) by mouth 2 (two) times daily. 11/13/19   Mariel Craft, MD  dicyclomine (BENTYL) 20 MG tablet Take 1 tablet (20 mg total) by mouth 2 (two) times daily. 06/07/20   Harris, Abigail, PA-C  ondansetron (ZOFRAN) 4 MG tablet Take 1 tablet (4 mg total) by mouth every 8 (eight) hours as needed for nausea or vomiting. 06/07/20   Arthor Captain, PA-C  risperiDONE (RISPERDAL M-TABS) 4 MG disintegrating tablet Take 1 tablet (4 mg total) by mouth at bedtime. 11/13/19   Mariel Craft, MD  risperiDONE (RISPERDAL) 2 MG tablet Take 1 tablet (2 mg total) by mouth daily. 11/14/19   Mariel Craft, MD      Allergies    Patient has no known allergies.    Review of Systems   Review of Systems  Constitutional:  Negative for appetite change.  Respiratory:  Negative for shortness of breath.    Musculoskeletal:  Positive for back pain.  Neurological:  Negative for weakness and numbness.   Physical Exam Updated Vital Signs BP (!) 203/100   Pulse 69   Temp 98.4 F (36.9 C) (Oral)   Resp 18   Ht 6\' 2"  (1.88 m)   Wt 120.2 kg   SpO2 98%   BMI 34.02 kg/m  Physical Exam Vitals and nursing note reviewed.  Cardiovascular:     Rate and Rhythm: Regular rhythm.  Abdominal:     Tenderness: There is no abdominal tenderness.  Musculoskeletal:     Comments: Neurovascular intact in bilateral lower legs.  Pain with straight leg raise particular on the right with mild on the left.  Perineal sensation grossly intact.  No pelvic tenderness but does have some mild lumbar tenderness.  Neurological:     Mental Status: He is alert.    ED Results / Procedures / Treatments   Labs (all labs ordered are listed, but only abnormal results are displayed) Labs Reviewed - No data to display  EKG None  Radiology DG Lumbar Spine Complete  Result Date: 06/23/2021 CLINICAL DATA:  Low back pain EXAM: LUMBAR SPINE - COMPLETE 4+ VIEW COMPARISON:  None Available. FINDINGS: There is no evidence of lumbar spine fracture. Alignment is normal. Intervertebral disc spaces are maintained. IMPRESSION: Negative. Electronically Signed  By: Charlett Nose M.D.   On: 06/23/2021 19:40    Procedures Procedures    Medications Ordered in ED Medications  lidocaine (LIDODERM) 5 % 1 patch (1 patch Transdermal Patch Applied 06/23/21 1951)  diazepam (VALIUM) tablet 2 mg (2 mg Oral Given 06/23/21 1951)  ibuprofen (ADVIL) tablet 600 mg (600 mg Oral Given 06/23/21 1951)  ketorolac (TORADOL) 15 MG/ML injection 30 mg (30 mg Intramuscular Given 06/23/21 2043)  methocarbamol (ROBAXIN) tablet 500 mg (500 mg Oral Given 06/23/21 2052)  predniSONE (DELTASONE) tablet 60 mg (60 mg Oral Given 06/23/21 2042)    ED Course/ Medical Decision Making/ A&P                           Medical Decision Making Risk Prescription drug  management.   Patient with acute onset low back pain.  No fall.  No drug use fever no fevers.  No cancer history.  Overall low risk.  Given shot of Toradol and some other medicines here.  Steroids given for here and outpatient.  Likely musculoskeletal although does radiate down the leg.  Neurosurgery information given to follow-up with symptoms not improved but appears stable for discharge. Differential diagnosis does include life-threatening condition such as epidural abscess.        Final Clinical Impression(s) / ED Diagnoses Final diagnoses:  Strain of lumbar region, initial encounter    Rx / DC Orders ED Discharge Orders          Ordered    predniSONE (DELTASONE) 20 MG tablet  Daily        06/23/21 2112    methocarbamol (ROBAXIN) 500 MG tablet  Every 8 hours PRN        06/23/21 2112              Benjiman Core, MD 06/23/21 2142

## 2021-06-23 NOTE — ED Provider Triage Note (Signed)
Emergency Medicine Provider Triage Evaluation Note  James Green , a 36 y.o. male  was evaluated in triage.  Pt complains of back pain.  This started today after he sat down and heard a pop.  He reports difficulty bearing weight on his left leg as this makes his back hurt more.  Pain is across the lower back.  Even on both sides.   No drug use.  No fevers.  No changes to bowel or bladder function.   Physical Exam  BP (!) 121/105 (BP Location: Left Arm)   Pulse (!) 55   Temp 98.7 F (37.1 C) (Oral)   Resp 20   Ht 6\' 2"  (1.88 m)   Wt 120.2 kg   SpO2 99%   BMI 34.02 kg/m  Gen:   Awake, appears uncomfortable Resp:  Normal effort  MSK:   Standing on crutches.  Other:  Awake and alert.   Medical Decision Making  Medically screening exam initiated at 7:02 PM.  Appropriate orders placed.  James Green was informed that the remainder of the evaluation will be completed by another provider, this initial triage assessment does not replace that evaluation, and the importance of remaining in the ED until their evaluation is complete.      Derryl Harbor, Cristina Gong 06/23/21 06/25/21

## 2021-06-23 NOTE — ED Notes (Signed)
I provided reinforced discharge education based off of discharge instructions. Pt acknowledged and understood my education. Pt had no further questions/concerns for provider/myself.  °

## 2021-06-23 NOTE — ED Triage Notes (Signed)
Patient reports that he was sitting down and when he tried to stand he heard "a click" and began feeling pain and he started massaging his back and he heard another pop.  Patient states when he tries to stand he can not put pressure/stand due to increased pain to the buttock and back.

## 2021-07-11 ENCOUNTER — Other Ambulatory Visit: Payer: Self-pay

## 2021-07-11 ENCOUNTER — Ambulatory Visit
Admission: EM | Admit: 2021-07-11 | Discharge: 2021-07-11 | Disposition: A | Discharge status: Home / Self Care | Payer: Commercial Managed Care - HMO | Attending: Internal Medicine | Admitting: Internal Medicine

## 2021-07-11 ENCOUNTER — Encounter: Payer: Self-pay | Admitting: Emergency Medicine

## 2021-07-11 DIAGNOSIS — M5432 Sciatica, left side: Secondary | ICD-10-CM | POA: Diagnosis not present

## 2021-07-11 MED ORDER — PREDNISONE 10 MG (21) PO TBPK
ORAL_TABLET | Freq: Every day | ORAL | 0 refills | Status: AC
Start: 2021-07-11 — End: ?

## 2021-07-11 NOTE — ED Triage Notes (Signed)
Pt here for lower back pain with radiation to left leg x 2 weeks; pt seen in ED for same and had xray; pt was given meds for sciatica but sts they didn't work

## 2021-07-11 NOTE — ED Provider Notes (Signed)
[facility]    CSN: [id number] Arrival date & time: 07/11/21  1058      History   Chief Complaint Chief Complaint  Patient presents with   Back Pain   Leg Pain    HPI James Green is a 36 y.o. male.   Patient presents with left lower back pain that radiates down the backside of left leg that has been present for 2 to 3 weeks.  Patient was seen in the ED for same pain on 5/26 and was prescribed muscle relaxer as well as prednisone steroid.  He was also given IM Toradol.  He reports that he has not seen any improvement with any of these medications.  He has also taken ibuprofen and Tylenol with no improvement.  Patient reports the majority of pain radiates down posterior left leg.  He denies any apparent injury to the area.  He states that he was given contact information for spine specialty at ED visit but has lost the paper so he has not contacted them.  Denies urinary frequency, urinary or bowel continence, saddle anesthesia.  Movement exacerbates pain.   Back Pain Associated symptoms: leg pain   Leg Pain   History reviewed. No pertinent past medical history.  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 Authorizing Provider  predniSONE (STERAPRED UNI-PAK 21 TAB) 10 MG (21) TBPK tablet Take by mouth daily. Take 6 tabs by mouth daily  for 2 days, then 5 tabs for 2 days, then 4 tabs for 2 days, then 3 tabs for 2 days, 2 tabs for 2 days, then 1 tab by mouth daily for 2  days 07/11/21  Yes , Manasqu[place] E, FNP  carbamazepine (TEGRETOL) 100 MG chewable tablet Chew 3 tablets (300 mg total) by mouth 2 (two) times daily. 11/13/19   Mariel Craft, MD  dicyclomine (BENTYL) 20 MG tablet Take 1 tablet (20 mg total) by mouth 2 (two) times daily. 06/07/20   Harris, Cammy Copa, PA-C  methocarbamol (ROBAXIN) 500 MG tablet Take 1 tablet (500 mg total) by mouth every 8 (eight) hours as needed for muscle spasms. 06/23/21   Benjiman Core, MD  ondansetron (ZOFRAN) 4 MG tablet Take 1 tablet (4 mg total) by mouth every 8 (eight) hours as needed for nausea or vomiting. 06/07/20   Arthor Captain, PA-C  risperiDONE (RISPERDAL M-TABS) 4 MG disintegrating tablet Take 1 tablet (4 mg total) by mouth at bedtime. 11/13/19   Mariel Craft, MD  risperiDONE (RISPERDAL) 2 MG tablet Take 1 tablet (2 mg total) by mouth daily. 11/14/19   Mariel Craft, MD    Family History History reviewed. No pertinent family history.  Social History Social History   Tobacco Use   Smoking status: Every Day    Packs/day: 0.50    Types: Cigarettes   Smokeless tobacco: Never  Vaping Use   Vaping Use: Never used  Substance Use Topics   Alcohol use: Yes    Alcohol/week: 2.0 standard drinks of  alcohol    Types: 2 Cans of beer per week    Comment: ocassionaly    Drug use: Yes    Types: Marijuana     Allergies   Patient has no known allergies.   Review of Systems Review of Systems Per HPI  Physical Exam Triage Vital Signs ED Triage Vitals  Enc Vitals Group     BP 07/11/21 1132 124/81     Pulse Rate 07/11/21 1132 74     Resp 07/11/21 1132 18     Temp 07/11/21 1132 98.3 F (36.8 C)     Temp Source 07/11/21 1132 Oral     SpO2 07/11/21 1132 95 %     Weight --      Height --      Head Circumference --      Peak Flow --      Pain Score 07/11/21 1133 10     Pain Loc --      Pain Edu? --      Excl. in GC? --    No data found.  Updated Vital Signs BP 124/81 (BP Location: Left Arm)    Pulse 74   Temp 98.3 F (36.8 C) (Oral)   Resp 18   SpO2 95%   Visual Acuity Right Eye Distance:   Left Eye Distance:   Bilateral Distance:    Right Eye Near:   Left Eye Near:    Bilateral Near:     Physical Exam Constitutional:      General: He is not in acute distress.    Appearance: Normal appearance. He is not toxic-appearing or diaphoretic.  HENT:     Head: Normocephalic and atraumatic.  Eyes:     Extraocular Movements: Extraocular movements intact.     Conjunctiva/sclera: Conjunctivae normal.  Pulmonary:     Effort: Pulmonary effort is normal.  Musculoskeletal:     Lumbar back: Tenderness present. No bony tenderness. Positive left straight leg raise test. Negative right straight leg raise test.     Comments: No tenderness to palpation throughout left lower back or left posterior leg.  No direct spinal tenderness, crepitus, step-off.  Positive left straight leg raise.  Patient reports that the pain is present in the left lower back and left posterior leg directly over the buttocks.  Patient has full range of motion of leg.  Neurovascular intact.  Neurological:     General: No focal deficit present.     Mental Status: He is alert and oriented to person, place, and time. Mental status is at baseline.     Deep Tendon Reflexes: Reflexes are normal and symmetric.  Psychiatric:        Mood and Affect: Mood normal.        Behavior: Behavior normal.        Thought Content: Thought content normal.        Judgment: Judgment normal.      UC Treatments / Results  Labs (all labs ordered are listed, but only abnormal results are displayed) Labs Reviewed - No data to display  EKG   Radiology No results found.  Procedures Procedures (including critical care time)  Medications Ordered in UC Medications - No data to display  Initial Impression / Assessment and Plan / UC Course  I have reviewed the triage vital signs and the nursing notes.  Pertinent labs & imaging  results that were available during my care of the patient were reviewed by me and considered in my medical decision making (  see chart for details).     Patient's physical exam and symptoms are consistent with left-sided sciatica.  Limited options on pain management given the patient is already been taking NSAIDs, steroids, muscle relaxers, IM Toradol with minimal improvement.  Will treat with a higher dose and longer course of prednisone steroid to see if this is helpful.  Also think that it would be helpful for patient to see spine specialty given persistent and refractory symptoms.  Patient given contact information for spine specialty and encouraged to follow-up with them for further evaluation and management.  Discussed return and ER precautions.  Patient verbalized understanding and was agreeable with plan. Final Clinical Impressions(s) / UC Diagnoses   Final diagnoses:  Sciatica of left side     Discharge Instructions      You have been prescribed a higher dose and longer course of prednisone steroid to decrease inflammation associated with your pain.  Please also alternate ice and heat to affected area.  Follow-up with spine specialty at provided contact information for further evaluation and management.    ED Prescriptions     Medication Sig Dispense Auth. Provider   predniSONE (STERAPRED UNI-PAK 21 TAB) 10 MG (21) TBPK tablet Take by mouth daily. Take 6 tabs by mouth daily  for 2 days, then 5 tabs for 2 days, then 4 tabs for 2 days, then 3 tabs for 2 days, 2 tabs for 2 days, then 1 tab by mouth daily for 2 days 42 tablet [place], Acie Fredrickson, [place]      PDMP not reviewed this encounter.   Gustavus Bryant, [place] 07/11/21 1210

## 2021-07-11 NOTE — Discharge Instructions (Signed)
You have been prescribed a higher dose and longer course of prednisone steroid to decrease inflammation associated with your pain.  Please also alternate ice and heat to affected area.  Follow-up with spine specialty at provided contact information for further evaluation and management.

## 2021-12-20 IMAGING — DX DG KNEE COMPLETE 4+V*R*
4 series · 4 of 4 positions shown · non-contrast
Comparison: None.

CLINICAL DATA: Trauma to the right knee.

EXAM:
RIGHT KNEE - COMPLETE 4+ VIEW

[knee ap]
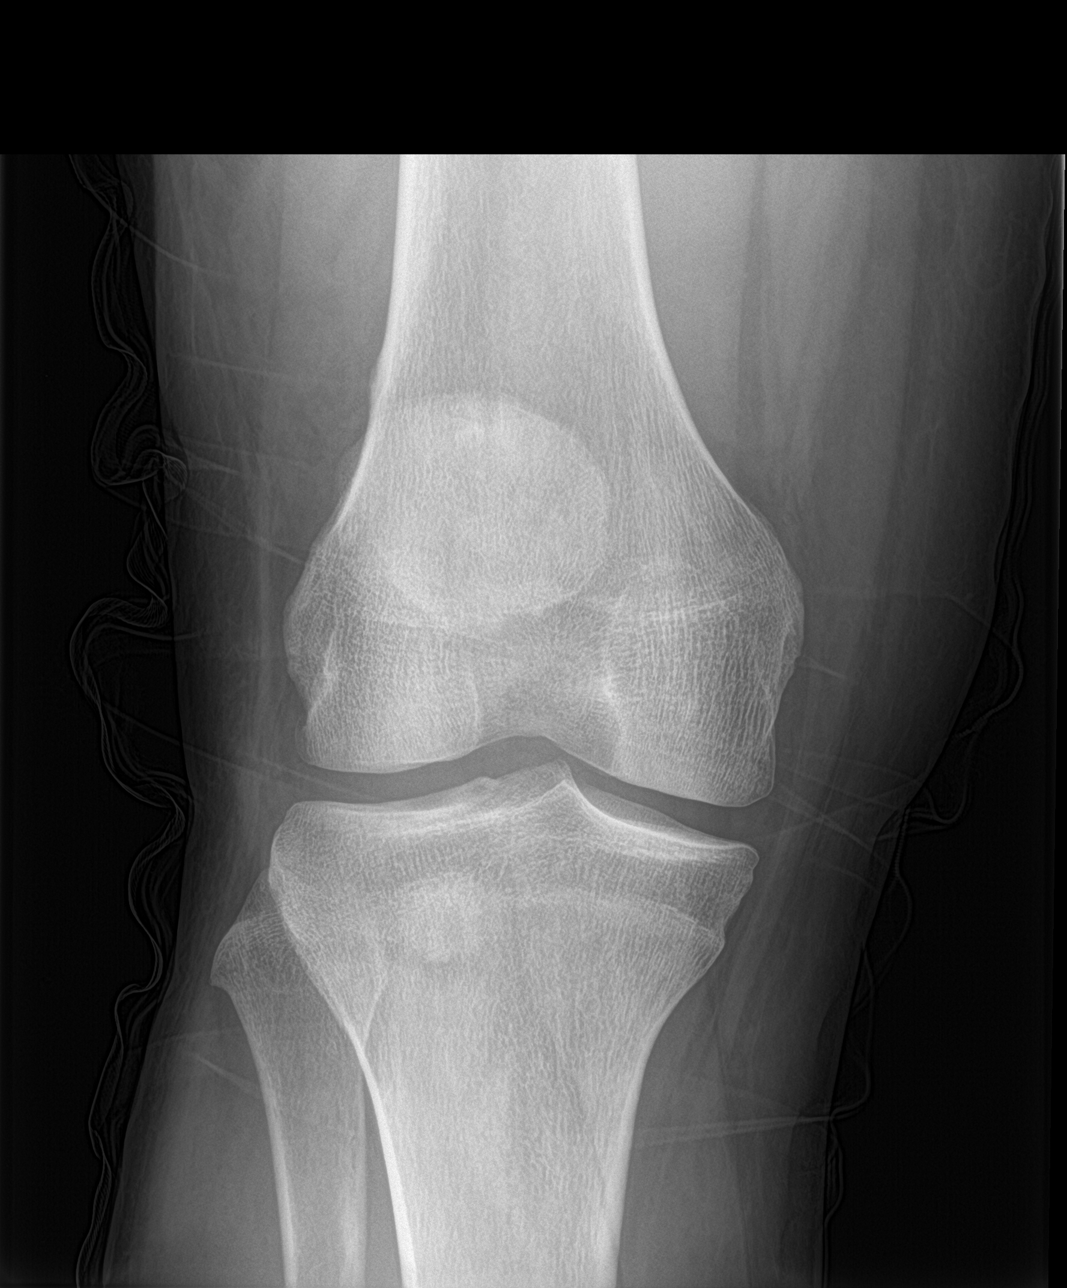

[knee lat]
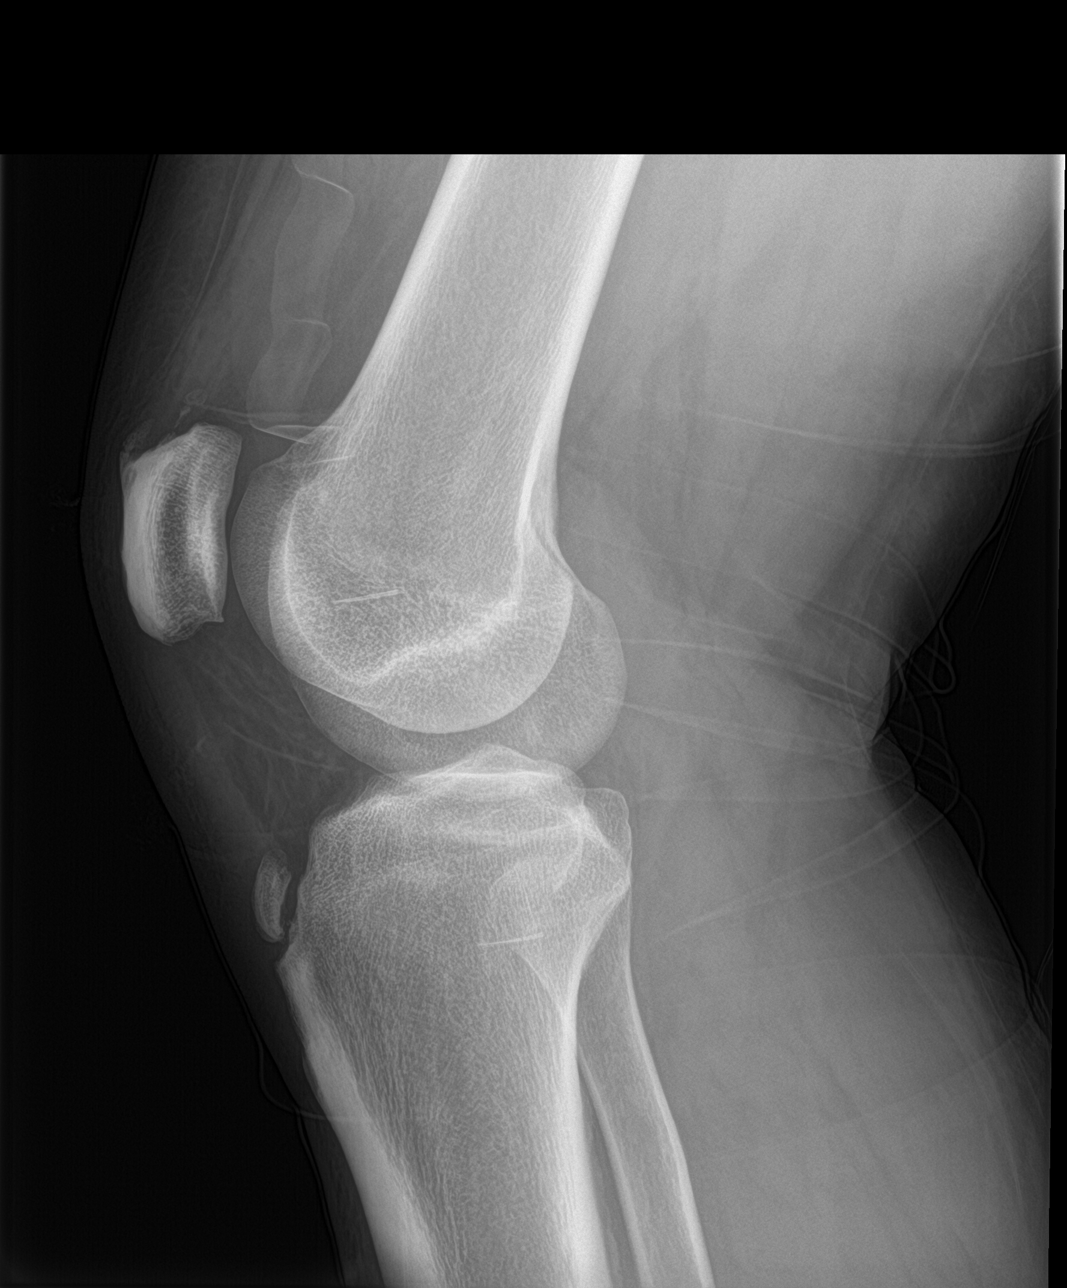

[knee obl (1 of 2)]
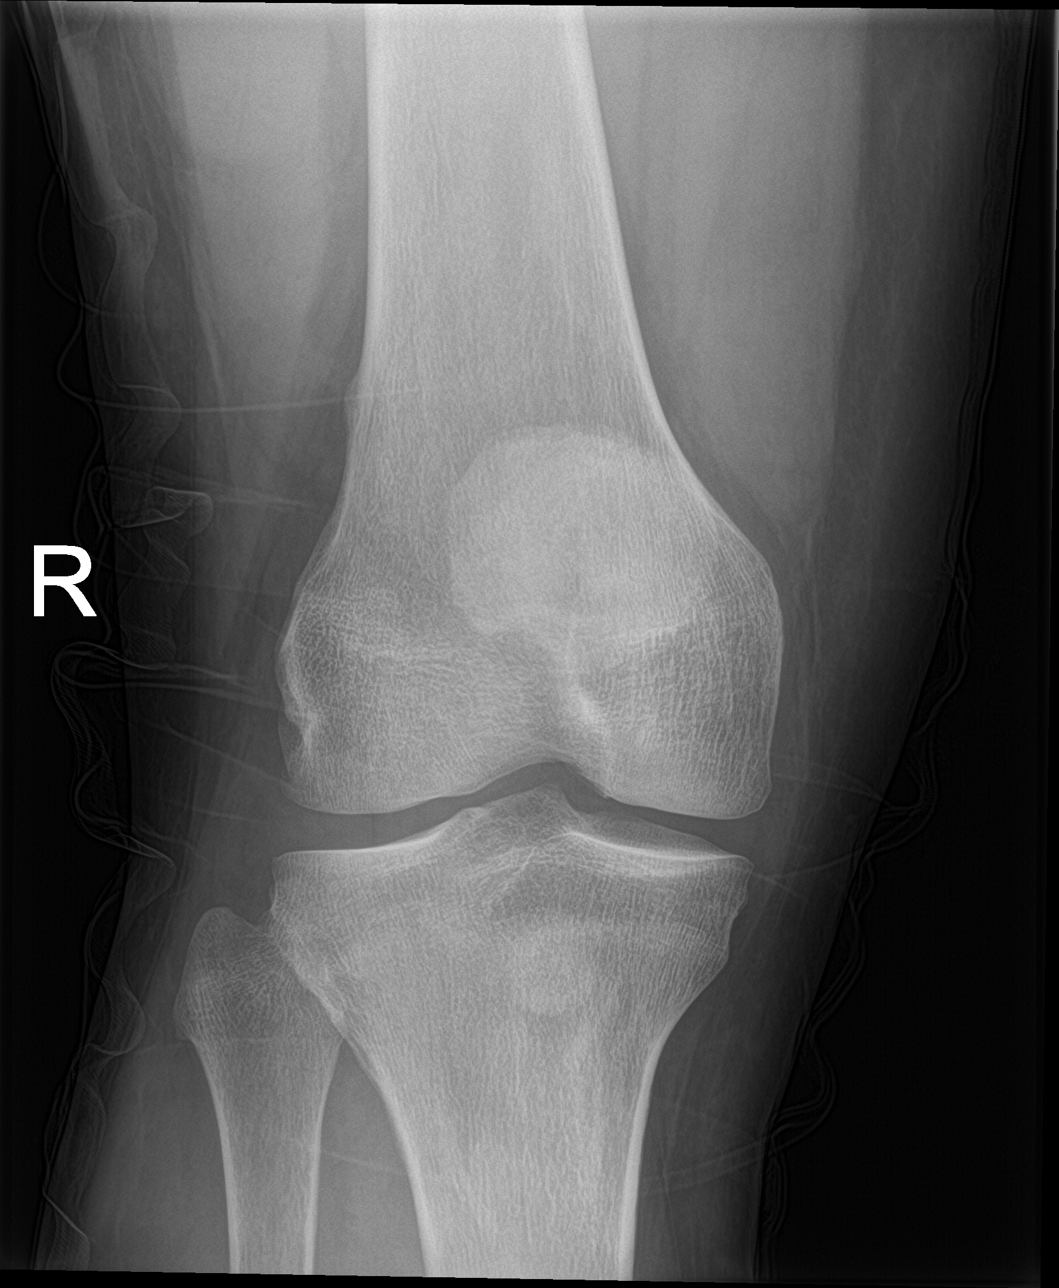

[knee obl (2 of 2)]
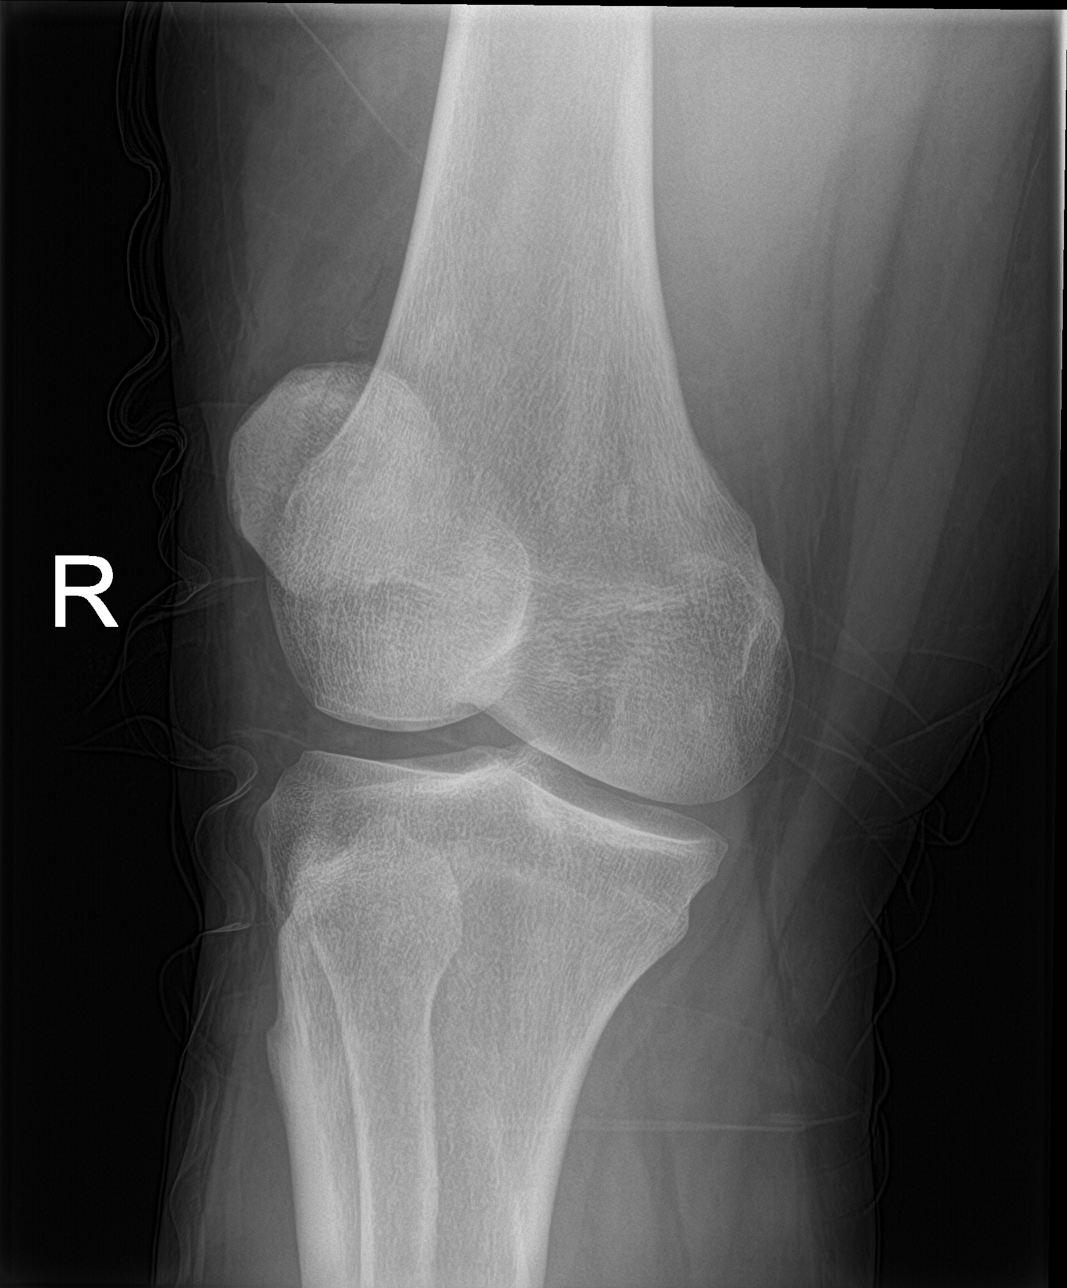

[4 of 4 positions shown; findings below may reference images not displayed]

FINDINGS: There is no acute fracture or dislocation. The bones are well
mineralized. No arthritic changes. Chronic bone fragment at the
insertion of the patellar tendon over the tibial tuberosity. No
significant joint effusion. The soft tissues are unremarkable.
IMPRESSION: No acute fracture or dislocation.
